# Patient Record
Sex: Male | Born: 1990 | Race: White | Hispanic: No | Marital: Single | Smoking: Current every day smoker
Health system: Southern US, Community
[De-identification: ages and names within clinical notes are randomized; demographics above are authoritative.]

## PROBLEM LIST (undated history)

## (undated) DIAGNOSIS — J029 Acute pharyngitis, unspecified: Secondary | ICD-10-CM

## (undated) DIAGNOSIS — K219 Gastro-esophageal reflux disease without esophagitis: Secondary | ICD-10-CM

## (undated) DIAGNOSIS — R0989 Other specified symptoms and signs involving the circulatory and respiratory systems: Secondary | ICD-10-CM

## (undated) DIAGNOSIS — M25311 Other instability, right shoulder: Secondary | ICD-10-CM

## (undated) DIAGNOSIS — F319 Bipolar disorder, unspecified: Secondary | ICD-10-CM

## (undated) HISTORY — PX: UMBILICAL GRANULOMA EXCISION: SHX2597

---
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
## 2010-06-02 LAB — RAPID URINE DRUG SCREEN, HOSP PERFORMED
Amphetamines: NOT DETECTED
Barbiturates: NOT DETECTED
Benzodiazepines: NOT DETECTED
Cocaine: NOT DETECTED
Opiates: NOT DETECTED
Tetrahydrocannabinol: POSITIVE — AB

## 2010-06-02 LAB — BASIC METABOLIC PANEL
BUN: 7 mg/dL (ref 6–23)
CO2: 26 mEq/L (ref 19–32)
Calcium: 9.4 mg/dL (ref 8.4–10.5)
Chloride: 108 mEq/L (ref 96–112)
Creatinine, Ser: 0.96 mg/dL (ref 0.4–1.5)
Glucose, Bld: 91 mg/dL (ref 70–99)
Potassium: 4 mEq/L (ref 3.5–5.1)
Sodium: 141 mEq/L (ref 135–145)

## 2010-06-02 LAB — DIFFERENTIAL
Basophils Absolute: 0 10*3/uL (ref 0.0–0.1)
Basophils Relative: 0 % (ref 0–1)
Monocytes Absolute: 0.4 10*3/uL (ref 0.2–1.2)
Neutro Abs: 8.5 10*3/uL — ABNORMAL HIGH (ref 1.7–8.0)

## 2010-06-02 LAB — CBC
HCT: 45 % (ref 36.0–49.0)
Hemoglobin: 15.4 g/dL (ref 12.0–16.0)
MCHC: 34.1 g/dL (ref 31.0–37.0)
MCV: 89.7 fL (ref 78.0–98.0)
Platelets: 173 10*3/uL (ref 150–400)
RBC: 5.02 MIL/uL (ref 3.80–5.70)
RDW: 13.1 % (ref 11.4–15.5)
WBC: 11.2 10*3/uL (ref 4.5–13.5)

## 2010-06-02 LAB — ETHANOL: Alcohol, Ethyl (B): <5 mg/dL (ref 0–10)

## 2010-11-22 LAB — RAPID STREP SCREEN (MED CTR MEBANE ONLY): Streptococcus, Group A Screen (Direct): NEGATIVE

## 2010-12-29 ENCOUNTER — Emergency Department (HOSPITAL_COMMUNITY): Payer: No Typology Code available for payment source

## 2010-12-29 ENCOUNTER — Emergency Department (HOSPITAL_COMMUNITY)
Admission: EM | Admit: 2010-12-29 | Discharge: 2010-12-29 | Disposition: A | Discharge status: Home / Self Care | Payer: No Typology Code available for payment source | Attending: Emergency Medicine | Admitting: Emergency Medicine

## 2010-12-29 DIAGNOSIS — S81009A Unspecified open wound, unspecified knee, initial encounter: Secondary | ICD-10-CM | POA: Insufficient documentation

## 2010-12-29 DIAGNOSIS — M25469 Effusion, unspecified knee: Secondary | ICD-10-CM | POA: Insufficient documentation

## 2010-12-29 DIAGNOSIS — Y9351 Activity, roller skating (inline) and skateboarding: Secondary | ICD-10-CM | POA: Insufficient documentation

## 2010-12-29 DIAGNOSIS — F17200 Nicotine dependence, unspecified, uncomplicated: Secondary | ICD-10-CM | POA: Insufficient documentation

## 2010-12-29 DIAGNOSIS — S8990XA Unspecified injury of unspecified lower leg, initial encounter: Secondary | ICD-10-CM

## 2010-12-29 DIAGNOSIS — IMO0002 Reserved for concepts with insufficient information to code with codable children: Secondary | ICD-10-CM | POA: Insufficient documentation

## 2010-12-29 DIAGNOSIS — S8000XA Contusion of unspecified knee, initial encounter: Secondary | ICD-10-CM | POA: Insufficient documentation

## 2010-12-29 DIAGNOSIS — M25569 Pain in unspecified knee: Secondary | ICD-10-CM | POA: Insufficient documentation

## 2010-12-29 DIAGNOSIS — S91009A Unspecified open wound, unspecified ankle, initial encounter: Secondary | ICD-10-CM | POA: Insufficient documentation

## 2010-12-29 MED ORDER — HYDROCODONE-ACETAMINOPHEN 5-325 MG PO TABS: 1.0000 | ORAL_TABLET | Freq: Four times a day (QID) | ORAL | Status: AC | PRN

## 2010-12-29 MED ORDER — TETANUS-DIPHTH-ACELL PERTUSSIS 5-2.5-18.5 LF-MCG/0.5 IM SUSP
INTRAMUSCULAR | Status: AC
  Filled 2010-12-29: qty 0.5

## 2010-12-29 MED ORDER — HYDROCODONE-ACETAMINOPHEN 5-325 MG PO TABS
1.0000 | ORAL_TABLET | Freq: Once | ORAL | Status: AC
  Administered 2010-12-29: 1 via ORAL

## 2010-12-29 MED ORDER — TETANUS-DIPHTH-ACELL PERTUSSIS 5-2-15.5 LF-MCG/0.5 IM SUSP
0.5000 mL | Freq: Once | INTRAMUSCULAR | Status: AC
  Administered 2010-12-29: 0.5 mL via INTRAMUSCULAR
  Filled 2010-12-29: qty 0.5

## 2010-12-29 MED ORDER — DOXYCYCLINE HYCLATE 100 MG PO CAPS: 100.0000 mg | ORAL_CAPSULE | Freq: Two times a day (BID) | ORAL | Status: AC

## 2010-12-29 MED ORDER — HYDROCODONE-ACETAMINOPHEN 5-325 MG PO TABS
ORAL_TABLET | ORAL | Status: AC
  Filled 2010-12-29: qty 1

## 2010-12-29 MED ORDER — DOXYCYCLINE HYCLATE 100 MG PO CAPS: 100.0000 mg | ORAL_CAPSULE | Freq: Two times a day (BID) | ORAL | Status: DC

## 2010-12-29 NOTE — ED Provider Notes (Signed)
Medical screening examination/treatment/procedure(s) were conducted as a shared visit with non-physician practitioner(s) and myself.  I personally evaluated the patient during the encounter Patient struck his knee a hard surface while skateboarding. Anterior knee with linear laceration with surrounding redness swelling and tenderness. X-ray viewed by me, no fracture. Agree with radiologist interpretation. Treat for wound infection  Doug Sou, MD 12/29/10 [phone]

## 2010-12-29 NOTE — ED Provider Notes (Signed)
History     CSN: [id number] Arrival date & time: 12/29/2010 12:02 PM   First MD Initiated Contact with Patient 12/29/10 1321      Chief Complaint  Patient presents with  . Knee Pain    pt in with c/o right knee pain states injured friday states swelling and unable to move knee states pain 8/10    (Consider location/radiation/quality/duration/timing/severity/associated sxs/prior treatment) Patient is a 20 y.o. male presenting with knee pain. The history is provided by the patient.  Knee Pain   patient states that he was skateboarding 2 nights ago and struck his right knee against the ramp and he had been having swelling and pain to the knee.  Patient also has a laceration to that knee as well.  Patient denies fever, numbness, weakness, vomiting, nausea, or drainage from the wound.  There is redness around the wound.  History reviewed. No pertinent past medical history.  History reviewed. No pertinent past surgical history.  No family history on file.  History  Substance Use Topics  . Smoking status: Current Everyday Smoker  . Smokeless tobacco: Not on file  . Alcohol Use: No      Review of Systems  All other systems reviewed and are negative.    Allergies  Review of patient's allergies indicates no known allergies.  Home Medications  No current outpatient prescriptions on file.  BP 127/65  Pulse 66  Temp(Src) 98.6 F (37 C) (Oral)  Resp 18  SpO2 100%  Physical Exam  Constitutional: He appears well-developed and well-nourished.  HENT:  Head: Normocephalic and atraumatic.  Eyes: Pupils are equal, round, and reactive to light.  Cardiovascular: Normal rate and regular rhythm.   Pulmonary/Chest: Effort normal and breath sounds normal.  Musculoskeletal:       Right knee: He exhibits swelling, ecchymosis and erythema.       Legs:   ED Course  Procedures (including critical care time)  Labs Reviewed - No data to display Dg Knee Complete 4 Views  Right  12/29/2010  *RADIOLOGY REPORT*  Clinical Data: Fall, knee pain  RIGHT KNEE - COMPLETE 4+ VIEW  Comparison: None.  Findings: Anterior knee patellar region soft tissue swelling. Normal alignment without fracture.  No effusion.  Preserved joint spaces.  IMPRESSION: Anterior patellar soft tissue swelling.  No acute osseous finding.  Original Report Authenticated By: Judie Petit. Ruel Favors, M.D.     No diagnosis found.    MDM  Patient does not have any fractures noted on x-ray.  The patient will be treated with an antibiotic due to the mild surrounding erythema to the small laceration.  The patient will be referred to orthopedics for followup on his knee.  Told to keep the wound clean and dry.  Return here for any worsening in his condition        Carlyle Dolly, [place] 12/29/10 [phone]

## 2012-10-04 ENCOUNTER — Encounter (HOSPITAL_COMMUNITY): Payer: Self-pay | Admitting: Emergency Medicine

## 2012-10-04 ENCOUNTER — Emergency Department (HOSPITAL_COMMUNITY)
Admission: EM | Admit: 2012-10-04 | Discharge: 2012-10-04 | Disposition: A | Discharge status: Home / Self Care | Payer: Medicaid Other | Attending: Emergency Medicine | Admitting: Emergency Medicine

## 2012-10-04 ENCOUNTER — Emergency Department (HOSPITAL_COMMUNITY): Payer: Medicaid Other

## 2012-10-04 DIAGNOSIS — M24419 Recurrent dislocation, unspecified shoulder: Secondary | ICD-10-CM | POA: Insufficient documentation

## 2012-10-04 DIAGNOSIS — M24411 Recurrent dislocation, right shoulder: Secondary | ICD-10-CM

## 2012-10-04 DIAGNOSIS — F17200 Nicotine dependence, unspecified, uncomplicated: Secondary | ICD-10-CM | POA: Insufficient documentation

## 2012-10-04 DIAGNOSIS — X500XXA Overexertion from strenuous movement or load, initial encounter: Secondary | ICD-10-CM | POA: Insufficient documentation

## 2012-10-04 DIAGNOSIS — Y9368 Activity, volleyball (beach) (court): Secondary | ICD-10-CM | POA: Insufficient documentation

## 2012-10-04 DIAGNOSIS — Y9239 Other specified sports and athletic area as the place of occurrence of the external cause: Secondary | ICD-10-CM | POA: Insufficient documentation

## 2012-10-04 MED ORDER — PROPOFOL 10 MG/ML IV BOLUS
0.5000 mg/kg | Freq: Once | INTRAVENOUS | Status: AC
  Administered 2012-10-04: 40.8 mg via INTRAVENOUS
  Filled 2012-10-04 (×2): qty 20

## 2012-10-04 MED ORDER — PROPOFOL 10 MG/ML IV BOLUS
INTRAVENOUS | Status: AC | PRN
  Administered 2012-10-04: 20 mg via INTRAVENOUS
  Administered 2012-10-04 (×3): 40 mg via INTRAVENOUS

## 2012-10-04 MED ORDER — HYDROCODONE-ACETAMINOPHEN 5-325 MG PO TABS: 2.0000 | ORAL_TABLET | ORAL | Status: DC | PRN

## 2012-10-04 MED ORDER — ONDANSETRON HCL 4 MG/2ML IJ SOLN
4.0000 mg | Freq: Once | INTRAMUSCULAR | Status: AC
  Administered 2012-10-04: 4 mg via INTRAVENOUS
  Filled 2012-10-04: qty 2

## 2012-10-04 NOTE — ED Notes (Signed)
Pt brought in by EMS. Pt states that he was playing volleyball, when he reached for the ball, and says that he over overextended his arm. Pt reports that he has dislocated his shoulder 2 or 3 times previously. Pt received 150 mcg of Fentanyl per EMS.

## 2012-10-04 NOTE — ED Provider Notes (Signed)
CSN: [id number]     Arrival date & time 10/04/12  2143 History     First MD Initiated Contact with Patient 10/04/12 2156     Chief Complaint  Patient presents with  . Shoulder Pain   HPI   Patient with prior shoulder dislocations x2. He states it is gone back in on its own. He's never been seen by a physician. Playing volleyball he said he put his right arm up in the air and did not make contact with [place] to clunk and pain in his shoulder and has not been able to move it withouta great deal of pain since Past Medical History  Diagnosis Date  . Shoulder dislocation     Previous dislocstions have been on the right side.    History reviewed. No pertinent past surgical history. History reviewed. No pertinent family history. History  Substance Use Topics  . Smoking status: Current Every Day Smoker -- 0.50 packs/day    Types: Cigarettes  . Smokeless tobacco: Not on file  . Alcohol Use: Yes    Review of Systems  Musculoskeletal: Positive for myalgias and arthralgias.  Neurological: Negative for weakness and numbness.    Allergies  Review of patient's allergies indicates no known allergies.  Home Medications   Current Outpatient Rx  Name  Route  Sig  Dispense  Refill  . HYDROcodone-acetaminophen (NORCO/VICODIN) 5-325 MG per tablet   Oral   Take 2 tablets by mouth every 4 (four) hours as needed for pain.   10 tablet   0    BP 125/72  Pulse 94  Temp(Src) 98.1 F (36.7 C) (Oral)  Resp 19  Ht 6\' 6"  (1.981 m)  Wt 180 lb (81.647 kg)  BMI 20.81 kg/m2  SpO2 98% Physical Exam Physical exam shows an awake alert. She is pain with any movement. His slumped forward holding his right arm across to his legs and holding his right forearm and his left arm. His plantar shoulder dislocation. His normal axillary nerve distribution sensation. Good pulses sensation in the right upper extremity. No pain in the chest wall. HEENT normal lungs clear heart regular abdomen soft neurological  intact ED Course   Procedural sedation Date/Time: 10/04/2012 10:35 PM Performed by: Claudean Kinds Authorized by: Claudean Kinds Consent: Verbal consent obtained. written consent not obtained. Risks and benefits: risks, benefits and alternatives were discussed Consent given by: patient Patient understanding: patient states understanding of the procedure being performed Patient identity confirmed: verbally with patient Time out: Immediately prior to procedure a "time out" was called to verify the correct patient, procedure, equipment, support staff and site/side marked as required. Local anesthesia used: no Patient sedated: yes Sedatives: propofol Sedation start date/time: 10/04/2012 10:35 PM Sedation end date/time: 10/04/2012 10:45 AM Vitals: Vital signs were monitored during sedation. Patient tolerance: Patient tolerated the procedure well with no immediate complications.  Reduction of dislocation Date/Time: 10/04/2012 10:35 PM Performed by: Claudean Kinds Authorized by: Claudean Kinds Consent: Verbal consent obtained. written consent obtained. Risks and benefits: risks, benefits and alternatives were discussed Consent given by: patient Patient understanding: patient states understanding of the procedure being performed Patient identity confirmed: verbally with patient and arm band Time out: Immediately prior to procedure a "time out" was called to verify the correct patient, procedure, equipment, support staff and site/side marked as required. Patient sedated: yes Sedatives: propofol Patient tolerance: Patient tolerated the procedure well with no immediate complications. Comments: Easily reduced with   (including critical care time)  Labs Reviewed - No data to display No results found. 1. Shoulder dislocation, recurrent, right     MDM  He tolerated the sedation and relocation very well post procedure x-ray showed good location.  Normal N-v exam after  relocation.  Plan, Ortho F/U, Norco, Ice.   Claudean Kinds, MD 10/04/12 [phone]

## 2012-10-04 NOTE — ED Notes (Signed)
MD at bedside. 

## 2013-04-23 ENCOUNTER — Emergency Department (HOSPITAL_COMMUNITY)
Admission: EM | Admit: 2013-04-23 | Discharge: 2013-04-23 | Disposition: A | Discharge status: Home / Self Care | Payer: Medicaid Other | Attending: Emergency Medicine | Admitting: Emergency Medicine

## 2013-04-23 ENCOUNTER — Emergency Department (HOSPITAL_COMMUNITY): Payer: Medicaid Other

## 2013-04-23 ENCOUNTER — Encounter (HOSPITAL_COMMUNITY): Payer: Self-pay | Admitting: Emergency Medicine

## 2013-04-23 DIAGNOSIS — Y92838 Other recreation area as the place of occurrence of the external cause: Secondary | ICD-10-CM

## 2013-04-23 DIAGNOSIS — S43016A Anterior dislocation of unspecified humerus, initial encounter: Secondary | ICD-10-CM | POA: Insufficient documentation

## 2013-04-23 DIAGNOSIS — Y9379 Activity, other specified sports and athletics: Secondary | ICD-10-CM | POA: Insufficient documentation

## 2013-04-23 DIAGNOSIS — X500XXA Overexertion from strenuous movement or load, initial encounter: Secondary | ICD-10-CM | POA: Insufficient documentation

## 2013-04-23 DIAGNOSIS — S43014A Anterior dislocation of right humerus, initial encounter: Secondary | ICD-10-CM

## 2013-04-23 DIAGNOSIS — F17200 Nicotine dependence, unspecified, uncomplicated: Secondary | ICD-10-CM | POA: Insufficient documentation

## 2013-04-23 DIAGNOSIS — Y9239 Other specified sports and athletic area as the place of occurrence of the external cause: Secondary | ICD-10-CM | POA: Insufficient documentation

## 2013-04-23 MED ORDER — FENTANYL CITRATE 0.05 MG/ML IJ SOLN
50.0000 ug | Freq: Once | INTRAMUSCULAR | Status: AC
  Administered 2013-04-23: 50 ug via INTRAVENOUS
  Filled 2013-04-23: qty 2

## 2013-04-23 MED ORDER — PROPOFOL 10 MG/ML IV BOLUS
INTRAVENOUS | Status: AC | PRN
  Administered 2013-04-23: 40 mg via INTRAVENOUS

## 2013-04-23 MED ORDER — OXYCODONE-ACETAMINOPHEN 5-325 MG PO TABS: 1.0000 | ORAL_TABLET | ORAL | Status: DC | PRN

## 2013-04-23 MED ORDER — PROPOFOL 10 MG/ML IV BOLUS
0.5000 mg/kg | Freq: Once | INTRAVENOUS | Status: AC
  Administered 2013-04-23: 39.7 mg via INTRAVENOUS
  Filled 2013-04-23: qty 1

## 2013-04-23 NOTE — Discharge Instructions (Signed)
Keep your arm in a shoulder immobilizer until you see the orthopedic physician. Call on Monday for an appointment as soon as possible.   Shoulder Dislocation Your shoulder is made up of three bones: the collar bone (clavicle); the shoulder blade (scapula), which includes the socket (glenoid cavity); and the upper arm bone (humerus). Your shoulder joint is the place where these bones meet. Strong, fibrous tissues hold these bones together (ligaments). Muscles and strong, fibrous tissues that connect the muscles to these bones (tendons) allow your arm to move through this joint. The range of motion of your shoulder joint is more extensive than most of your other joints, and the glenoid cavity is very shallow. That is the reason that your shoulder joint is one of the most unstable joints in your body. It is far more prone to dislocation than your other joints. Shoulder dislocation is when your humerus is forced out of your shoulder joint. CAUSES Shoulder dislocation is caused by a forceful impact on your shoulder. This impact usually is from an injury, such as a sports injury or a fall. SYMPTOMS Symptoms of shoulder dislocation include:  Deformity of your shoulder.  Intense pain.  Inability to move your shoulder joint.  Numbness, weakness, or tingling around your shoulder joint (your neck or down your arm).  Bruising or swelling around your shoulder. DIAGNOSIS In order to diagnose a dislocated shoulder, your caregiver will perform a physical exam. Your caregiver also may have an X-ray exam done to see if you have any broken bones. Magnetic resonance imaging (MRI) is a procedure that sometimes is done to help your caregiver see any damage to the soft tissues around your shoulder, particularly your rotator cuff tendons. Additionally, your caregiver also may have electromyography done to measure the electrical discharges produced in your muscles if you have signs or symptoms of nerve  damage. TREATMENT A shoulder dislocation is treated by placing the humerus back in the joint (reduction). Your caregiver does this either manually (closed reduction), by moving your humerus back into the joint through manipulation, or through surgery (open reduction). When your humerus is back in place, severe pain should improve almost immediately. You also may need to have surgery if you have a weak shoulder joint or ligaments, and you have recurring shoulder dislocations, despite rehabilitation. In rare cases, surgery is necessary if your nerves or blood vessels are damaged during the dislocation. After your reduction, your arm will be placed in a shoulder immobilizer or sling to keep it from moving. Your caregiver will have you wear your shoulder immoblizer or sling for 3 days to 3 weeks, depending on how serious your dislocation is. When your shoulder immobilizer or sling is removed, your caregiver may prescribe physical therapy to help improve the range of motion in your shoulder joint. HOME CARE INSTRUCTIONS  The following measures can help to reduce pain and speed up the healing process:  Rest your injured joint. Do not move it. Avoid activities similar to the one that caused your injury.  Apply ice to your injured joint for the first day or two after your reduction or as directed by your caregiver. Applying ice helps to reduce inflammation and pain.  Put ice in a plastic bag.  Place a towel between your skin and the bag.  Leave the ice on for 15-20 minutes at a time, every 2 hours while you are awake.  Exercise your hand by squeezing a soft ball. This helps to eliminate stiffness and swelling in your hand  and wrist.  Take over-the-counter or prescription medicine for pain or discomfort as told by your caregiver. SEEK IMMEDIATE MEDICAL CARE IF:   Your shoulder immobilizer or sling becomes damaged.  Your pain becomes worse rather than better.  You lose feeling in your arm or hand,  or they become white and cold. MAKE SURE YOU:   Understand these instructions.  Will watch your condition.  Will get help right away if you are not doing well or get worse. Document Released: 11/05/2000 Document Revised: 05/05/2011 Document Reviewed: 12/01/2010 [facility] PaExitCare Patient Information 2014 [place], [place].  Acetaminophen; Oxycodone tablets What is this medicine? ACETAMINOPHEN; OXYCODONE (a set a MEE noe fen; ox i KOE done) is a pain reliever. It is used to treat mild to moderate pain. This medicine may be used for other purposes; ask your health care provider or pharmacist if you have questions. COMMON BRAND NAME(S): Endocet, Magnacet, Narvox, Percocet, Perloxx, Primalev, Primlev, Roxicet, Xolox What should I tell my health care provider before I take this medicine? They need to know if you have any of these conditions: -brain tumor -Crohn's disease, inflammatory bowel disease, or ulcerative colitis -drug abuse or addiction -head injury -heart or circulation problems -if you often drink alcohol -kidney disease or problems going to the bathroom -liver disease -lung disease, asthma, or breathing problems -an unusual or allergic reaction to acetaminophen, oxycodone, other opioid analgesics, other medicines, foods, dyes, or preservatives -pregnant or trying to get pregnant -breast-feeding How should I use this medicine? Take this medicine by mouth with a full glass of water. Follow the directions on the prescription label. Take your medicine at regular intervals. Do not take your medicine more often than directed. Talk to your pediatrician regarding the use of this medicine in children. Special care may be needed. Patients over 23 years old may have a stronger reaction and need a smaller dose. Overdosage: If you think you have taken too much of this medicine contact a poison control center or emergency room at once. NOTE: This medicine is only for you. Do not share this medicine  with others. What if I miss a dose? If you miss a dose, take it as soon as you can. If it is almost time for your next dose, take only that dose. Do not take double or extra doses. What may interact with this medicine? -alcohol -antihistamines -barbiturates like amobarbital, butalbital, butabarbital, methohexital, pentobarbital, phenobarbital, thiopental, and secobarbital -benztropine -drugs for bladder problems like solifenacin, trospium, oxybutynin, tolterodine, hyoscyamine, and methscopolamine -drugs for breathing problems like ipratropium and tiotropium -drugs for certain stomach or intestine problems like propantheline, homatropine methylbromide, glycopyrrolate, atropine, belladonna, and dicyclomine -general anesthetics like etomidate, ketamine, nitrous oxide, propofol, desflurane, enflurane, halothane, isoflurane, and sevoflurane -medicines for depression, anxiety, or psychotic disturbances -medicines for sleep -muscle relaxants -naltrexone -narcotic medicines (opiates) for pain -phenothiazines like perphenazine, thioridazine, chlorpromazine, mesoridazine, fluphenazine, prochlorperazine, promazine, and trifluoperazine -scopolamine -tramadol -trihexyphenidyl This list may not describe all possible interactions. Give your health care provider a list of all the medicines, herbs, non-prescription drugs, or dietary supplements you use. Also tell them if you smoke, drink alcohol, or use illegal drugs. Some items may interact with your medicine. What should I watch for while using this medicine? Tell your doctor or health care professional if your pain does not go away, if it gets worse, or if you have new or a different type of pain. You may develop tolerance to the medicine. Tolerance means that you will need a higher dose of  the medication for pain relief. Tolerance is normal and is expected if you take this medicine for a long time. Do not suddenly stop taking your medicine because you may  develop a severe reaction. Your body becomes used to the medicine. This does NOT mean you are addicted. Addiction is a behavior related to getting and using a drug for a non-medical reason. If you have pain, you have a medical reason to take pain medicine. Your doctor will tell you how much medicine to take. If your doctor wants you to stop the medicine, the dose will be slowly lowered over time to avoid any side effects. You may get drowsy or dizzy. Do not drive, use machinery, or do anything that needs mental alertness until you know how this medicine affects you. Do not stand or sit up quickly, especially if you are an older patient. This reduces the risk of dizzy or fainting spells. Alcohol may interfere with the effect of this medicine. Avoid alcoholic drinks. There are different types of narcotic medicines (opiates) for pain. If you take more than one type at the same time, you may have more side effects. Give your health care provider a list of all medicines you use. Your doctor will tell you how much medicine to take. Do not take more medicine than directed. Call emergency for help if you have problems breathing. The medicine will cause constipation. Try to have a bowel movement at least every 2 to 3 days. If you do not have a bowel movement for 3 days, call your doctor or health care professional. Do not take Tylenol (acetaminophen) or medicines that have acetaminophen with this medicine. Too much acetaminophen can be very dangerous. Many nonprescription medicines contain acetaminophen. Always read the labels carefully to avoid taking more acetaminophen. What side effects may I notice from receiving this medicine? Side effects that you should report to your doctor or health care professional as soon as possible: -allergic reactions like skin rash, itching or hives, swelling of the face, lips, or tongue -breathing difficulties, wheezing -confusion -light headedness or fainting spells -severe  stomach pain -unusually weak or tired -yellowing of the skin or the whites of the eyes  Side effects that usually do not require medical attention (report to your doctor or health care professional if they continue or are bothersome): -dizziness -drowsiness -nausea -vomiting This list may not describe all possible side effects. Call your doctor for medical advice about side effects. You may report side effects to FDA at 1-800-FDA-1088. Where should I keep my medicine? Keep out of the reach of children. This medicine can be abused. Keep your medicine in a safe place to protect it from theft. Do not share this medicine with anyone. Selling or giving away this medicine is dangerous and against the law. Store at room temperature between 20 and 25 degrees C (68 and 77 degrees F). Keep container tightly closed. Protect from light. This medicine may cause accidental overdose and death if it is taken by other adults, children, or pets. Flush any unused medicine down the toilet to reduce the chance of harm. Do not use the medicine after the expiration date. NOTE: This sheet is a summary. It may not cover all possible information. If you have questions about this medicine, talk to your doctor, pharmacist, or health care provider.  2014, [employer]. (2012-10-04 13:17:35)

## 2013-04-23 NOTE — ED Notes (Signed)
Patient is alert and oriented x3.  He was given DC instructions and follow up visit instructions.  Patient gave verbal understanding.  He was DC ambulatory under his own power to home.  V/S stable.  He was not showing any signs of distress on DC 

## 2013-04-23 NOTE — ED Provider Notes (Signed)
CSN: [id number]     Arrival date & time 04/23/13  0200 History   First MD Initiated Contact with Patient 04/23/13 0300     Chief Complaint  Patient presents with  . Dislocation     (Consider location/radiation/quality/duration/timing/severity/associated sxs/prior Treatment) The history is provided by the patient.   23 year old male with history of recurrent right shoulder dislocations states that he was playing table tennis when his right shoulder popped out of place. He had been drinking and admits to having about 6 ounces of gin with the last consumption about 2 hours ago. He is complaining of pain in his shoulder and he rates it at 10/10. He is a cigarette smoker.  Past Medical History  Diagnosis Date  . Shoulder dislocation     Previous dislocstions have been on the right side.    History reviewed. No pertinent past surgical history. History reviewed. No pertinent family history. History  Substance Use Topics  . Smoking status: Current Every Day Smoker -- 0.50 packs/day    Types: Cigarettes  . Smokeless tobacco: Not on file  . Alcohol Use: Yes    Review of Systems  All other systems reviewed and are negative.      Allergies  Review of patient's allergies indicates no known allergies.  Home Medications   Current Outpatient Rx  Name  Route  Sig  Dispense  Refill  . Pseudoephedrine-APAP-DM (DAYQUIL PO)   Oral   Take 1-2 capsules by mouth 2 (two) times daily as needed (cold symptoms).          BP 128/70  Pulse 85  Temp(Src) 98.5 F (36.9 C) (Oral)  Resp 16  SpO2 98% Physical Exam  Nursing note and vitals reviewed.  23 year old male, resting comfortably and in no acute distress. Vital signs are normal. Oxygen saturation is 98%, which is normal. Head is normocephalic and atraumatic. PERRLA, EOMI. Oropharynx is clear. Neck is nontender and supple without adenopathy or JVD. Back is nontender and there is no CVA tenderness. Lungs are clear without rales,  wheezes, or rhonchi. Chest is nontender. Heart has regular rate and rhythm without murmur. Abdomen is soft, flat, nontender without masses or hepatosplenomegaly and peristalsis is normoactive. Extremities:.Deformity of the right shoulder is present consistent with anterior dislocation. There is pain on any passive range of motion. Distal neurovascular exam is intact with prompt capillary refill, normal sensation, and strong pulses. Skin is warm and dry without rash. Neurologic: Mental status is normal, cranial nerves are intact, there are no motor or sensory deficits.  ED Course  Reduction of dislocation Date/Time: 04/23/2013 4:15 AM Performed by: Dione Booze Authorized by: Preston Fleeting, Lauralei Clouse Consent: Verbal consent obtained. written consent not obtained. Risks and benefits: risks, benefits and alternatives were discussed Consent given by: patient Patient understanding: patient states understanding of the procedure being performed Patient consent: the patient's understanding of the procedure matches consent given Procedure consent: procedure consent matches procedure scheduled Relevant documents: relevant documents present and verified Test results: test results available and properly labeled Site marked: the operative site was marked Imaging studies: imaging studies available Required items: required blood products, implants, devices, and special equipment available Patient identity confirmed: verbally with patient and arm band Time out: Immediately prior to procedure a "time out" was called to verify the correct patient, procedure, equipment, support staff and site/side marked as required. Local anesthesia used: no Patient sedated: yes Sedatives: propofol Analgesia: fentanyl Sedation start date/time: 04/23/2013 4:15 AM Sedation end date/time: 04/23/2013 4:45 AM Patient  tolerance: Patient tolerated the procedure well with no immediate complications. Comments: Right shoulder dislocation  reduced by manipulation. Neurovascular status after reduction was normal. He was placed in a shoulder immobilizer and post-reduction x-ray ordered.   (including critical care time) Procedural sedation Performed by: WUJWJ,XBJYNGLICK,Jahmiyah Dullea Consent: Verbal consent obtained. Risks and benefits: risks, benefits and alternatives were discussed Required items: required blood products, implants, devices, and special equipment available Patient identity confirmed: arm band and provided demographic data Time out: Immediately prior to procedure a "time out" was called to verify the correct patient, procedure, equipment, support staff and site/side marked as required.  Sedation type: moderate (conscious) sedation NPO time confirmed and considedered  Sedatives: PROPOFOL  Physician Time at Bedside: 30 minutes  Vitals: Vital signs were monitored during sedation. Cardiac Monitor, pulse oximeter Patient tolerance: Patient tolerated the procedure well with no immediate complications. Comments: Pt with uneventful recovered. Returned to pre-procedural sedation baseline   Imaging Review Dg Shoulder Right  04/23/2013   CLINICAL DATA:  Status post reduction of right shoulder dislocation.  EXAM: RIGHT SHOULDER - 2+ VIEW  COMPARISON:  Right shoulder radiographs performed earlier today at 3:20 a.m.  FINDINGS: There has been successful reduction of the previously noted right humeral head dislocation. An associated Hill-Sachs lesion is seen. There is slight cortical irregularity at the inferior glenoid, without a definite osseous Bankart lesion. The right acromioclavicular joint is unremarkable in appearance. The visualized portions of the right lung are clear. No significant soft tissue abnormalities are characterized on radiograph.  IMPRESSION: Successful reduction of right femoral head dislocation. Associated Hill-Sachs lesion seen. Slight cortical irregularity at the inferior glenoid, without a definite osseous Bankart  lesion.   Electronically Signed   By: Roanna RaiderJeffery  Chang M.D.   On: 04/23/2013 04:43   Dg Shoulder Right  04/23/2013   CLINICAL DATA:  Dislocation  EXAM: RIGHT SHOULDER - 2+ VIEW  COMPARISON:  Prior radiograph from 10/04/2012  FINDINGS: The right humeral head is dislocated anteriorly and inferiorly relative to the glenoid. No definite associated fracture. The [facility] joint is approximated.  Visualize right hemi thorax is clear.  IMPRESSION: Anterior inferior right shoulder dislocation. No associated fracture identified.   Electronically Signed   By: Rise MuBenjamin  McClintock M.D.   On: 04/23/2013 03:48   Images viewed by me.  MDM   Final diagnoses:  Closed anterior dislocation of right shoulder    Recurrent right shoulder dislocation. Old records are reviewed and he has been seen in approximately 6 months ago for right shoulder dislocation. An attempt to reduce the dislocation by scapular manipulation was not successful. He'll be sent for x-ray and will need procedural sedation to reduce his dislocation.  Following reduction, x-ray shows that the joint has been relocated and Hill-Sachs lesion is present. He is placed in a shoulder immobilizer and referred to orthopedics for followup. Because of recurrent dislocations, he should be considered for possible surgical management. He is given prescription for oxycodone and acetaminophen for pain.  Dione Boozeavid Semya Klinke, MD 04/23/13 [phone]

## 2013-04-23 NOTE — ED Notes (Signed)
Patient is alert and oriented x3.  He is complaining of pain with a dislocated right shoulder while playing ping pong. Currently he rates his pain 10 of 10.  He has dislocated this shoulder multiple times.

## 2013-04-23 NOTE — ED Notes (Signed)
Pt arrived to the ED with a complaint of a right shoulder dislocation.  Pt has a hx of dislocation of same.  Pt was playing pingpong when his shoulder popped out.  Pt states he has been told he needs surgery but hasn't had it done.

## 2013-05-19 ENCOUNTER — Emergency Department (HOSPITAL_COMMUNITY): Payer: Medicaid Other

## 2013-05-19 ENCOUNTER — Encounter (HOSPITAL_COMMUNITY): Payer: Self-pay | Admitting: Emergency Medicine

## 2013-05-19 ENCOUNTER — Emergency Department (HOSPITAL_COMMUNITY)
Admission: EM | Admit: 2013-05-19 | Discharge: 2013-05-19 | Disposition: A | Discharge status: Home / Self Care | Payer: Medicaid Other | Attending: Emergency Medicine | Admitting: Emergency Medicine

## 2013-05-19 DIAGNOSIS — X500XXA Overexertion from strenuous movement or load, initial encounter: Secondary | ICD-10-CM | POA: Insufficient documentation

## 2013-05-19 DIAGNOSIS — Y9367 Activity, basketball: Secondary | ICD-10-CM | POA: Insufficient documentation

## 2013-05-19 DIAGNOSIS — M24419 Recurrent dislocation, unspecified shoulder: Secondary | ICD-10-CM

## 2013-05-19 DIAGNOSIS — S43016A Anterior dislocation of unspecified humerus, initial encounter: Secondary | ICD-10-CM | POA: Insufficient documentation

## 2013-05-19 DIAGNOSIS — Y9239 Other specified sports and athletic area as the place of occurrence of the external cause: Secondary | ICD-10-CM | POA: Insufficient documentation

## 2013-05-19 DIAGNOSIS — F17200 Nicotine dependence, unspecified, uncomplicated: Secondary | ICD-10-CM | POA: Insufficient documentation

## 2013-05-19 DIAGNOSIS — Y92838 Other recreation area as the place of occurrence of the external cause: Secondary | ICD-10-CM

## 2013-05-19 MED ORDER — LIDOCAINE HCL 2 % IJ SOLN
INTRAMUSCULAR | Status: AC
  Filled 2013-05-19: qty 20

## 2013-05-19 MED ORDER — SODIUM CHLORIDE 0.9 % IV SOLN
INTRAVENOUS | Status: AC | PRN
  Administered 2013-05-19: 1000 mL via INTRAVENOUS

## 2013-05-19 MED ORDER — IBUPROFEN 600 MG PO TABS: 600.0000 mg | ORAL_TABLET | Freq: Four times a day (QID) | ORAL | Status: DC | PRN

## 2013-05-19 MED ORDER — PROPOFOL 10 MG/ML IV BOLUS
200.0000 mg | Freq: Once | INTRAVENOUS | Status: DC
  Filled 2013-05-19: qty 1

## 2013-05-19 MED ORDER — LIDOCAINE HCL 1 % IJ SOLN
INTRAMUSCULAR | Status: AC
  Filled 2013-05-19: qty 20

## 2013-05-19 MED ORDER — HYDROCODONE-ACETAMINOPHEN 5-325 MG PO TABS: 1.0000 | ORAL_TABLET | ORAL | Status: DC | PRN

## 2013-05-19 MED ORDER — HYDROMORPHONE HCL PF 1 MG/ML IJ SOLN
1.0000 mg | Freq: Once | INTRAMUSCULAR | Status: AC
  Administered 2013-05-19: 1 mg via INTRAVENOUS
  Filled 2013-05-19: qty 1

## 2013-05-19 MED ORDER — PROPOFOL 10 MG/ML IV BOLUS
INTRAVENOUS | Status: AC | PRN
  Administered 2013-05-19: 140 mg via INTRAVENOUS

## 2013-05-19 MED ORDER — LIDOCAINE HCL (PF) 1 % IJ SOLN
30.0000 mL | Freq: Once | INTRAMUSCULAR | Status: AC
  Administered 2013-05-19: 30 mL
  Filled 2013-05-19: qty 30

## 2013-05-19 NOTE — ED Notes (Signed)
Pt cussing and yelling in the lobby and moved to Ed conference room. Xray has been notified as to not miss the patient when it is his time for xray. I notified the patient that there were 2 people ahead of him for xrays.

## 2013-05-19 NOTE — ED Notes (Signed)
Pt on his cell phone cursing and speaking very loud while having to wait in the lobby.  I asked pt to please stop cursing as it is disrespectful to the others waiting. Pt states that he has the right to speak however he feels.  Asked Security and [employer] to come speak with pt.

## 2013-05-19 NOTE — ED Notes (Signed)
Pt report shoulder popped out today while playing basketball. This has happened 8-10 in last 6 months pt reports. Pulses are present to lower extremity.

## 2013-05-19 NOTE — ED Notes (Signed)
Pt becoming agitated and verbally abusive after being told that he needed to have a ride come get him prior to d/c.  Pt removing sling and making attempts at dislocating shoulder again.  Pt redirected and educated on importance of using sling.  Pt remains verbally abusive towards staff and minimally cooperative.  EDP aware.

## 2013-05-19 NOTE — ED Notes (Signed)
Pt remains awaiting responsible ride.  Remains verbally abusive towards staff, threatening.  Off duty [employer] officer remains present.

## 2013-05-19 NOTE — ED Provider Notes (Signed)
CSN: [id number]     Arrival date & time 05/19/13  1604 History   First MD Initiated Contact with Patient 05/19/13 1900     No chief complaint on file.    (Consider location/radiation/quality/duration/timing/severity/associated sxs/prior Treatment) HPI  This is a 23 -year-old male with a history of recurrent right shoulder dislocation who presents with right shoulder pain. Patient reports he was playing basketball when his right shoulder popped out. Patient reports 8-10 dislocations in the last 6 months. He has not followed up with orthopedics because "I work too much." He denies any other injury.  He denies any weakness, numbness, or tingling in his extremities.  Past Medical History  Diagnosis Date  . Shoulder dislocation     Previous dislocstions have been on the right side.    History reviewed. No pertinent past surgical history. No family history on file. History  Substance Use Topics  . Smoking status: Current Every Day Smoker -- 0.50 packs/day    Types: Cigarettes  . Smokeless tobacco: Not on file  . Alcohol Use: Yes    Review of Systems  Constitutional: Negative.   Musculoskeletal:       Right shoulder pain  Neurological: Negative for weakness and numbness.  All other systems reviewed and are negative.      Allergies  Review of patient's allergies indicates no known allergies.  Home Medications   Current Outpatient Rx  Name  Route  Sig  Dispense  Refill  . oxyCODONE-acetaminophen (PERCOCET) 5-325 MG per tablet   Oral   Take 1 tablet by mouth every 4 (four) hours as needed.   20 tablet   0   . HYDROcodone-acetaminophen (NORCO/VICODIN) 5-325 MG per tablet   Oral   Take 1-2 tablets by mouth every 4 (four) hours as needed for severe pain.   10 tablet   0   . ibuprofen (ADVIL,MOTRIN) 600 MG tablet   Oral   Take 1 tablet (600 mg total) by mouth every 6 (six) hours as needed.   30 tablet   0    BP 126/77  Pulse 68  Temp(Src) 98.2 F (36.8 C) (Oral)   Resp 16  SpO2 100% Physical Exam  Nursing note and vitals reviewed. Constitutional: He is oriented to person, place, and time. No distress.  Thin  HENT:  Head: Normocephalic and atraumatic.  Cardiovascular: Normal rate and regular rhythm.   Pulmonary/Chest: Effort normal. No respiratory distress.  Musculoskeletal: He exhibits no edema.  Right arm held in flexion and internal rotation, glenoid empty, neurovascularly intact distally  Lymphadenopathy:    He has no cervical adenopathy.  Neurological: He is alert and oriented to person, place, and time.  Skin: Skin is warm and dry.  Psychiatric: He has a normal mood and affect.    ED Course  Reduction of dislocation Date/Time: 05/19/2013 9:27 PM Performed by: Ross MarcusHORTON, COURTNEY, F Authorized by: Ross MarcusHORTON, COURTNEY, F Consent: Verbal consent obtained. written consent obtained. Risks and benefits: risks, benefits and alternatives were discussed Consent given by: patient Patient understanding: patient states understanding of the procedure being performed Patient identity confirmed: verbally with patient and arm band Local anesthesia used: yes Anesthesia: local infiltration Local anesthetic: lidocaine 1% without epinephrine Anesthetic total: 20 ml Patient sedated: yes Sedation type: moderate (conscious) sedation Sedatives: propofol Patient tolerance: Patient tolerated the procedure well with no immediate complications.    Procedural sedation Performed by: Ross MarcusHORTON, COURTNEY, F Consent: Verbal consent obtained. Risks and benefits: risks, benefits and alternatives were discussed Required items: required  blood products, implants, devices, and special equipment available Patient identity confirmed: arm band and provided demographic data Time out: Immediately prior to procedure a "time out" was called to verify the correct patient, procedure, equipment, support staff and site/side marked as required.  Sedation type: moderate (conscious)  sedation NPO time confirmed and considedered  Sedatives: PROPOFOL  Physician Time at Bedside: 20 min  Vitals: Vital signs were monitored during sedation. Cardiac Monitor, pulse oximeter Patient tolerance: Patient tolerated the procedure well with no immediate complications. Comments: Pt with uneventful recovered. Returned to pre-procedural sedation baseline    (including critical care time) Labs Review Labs Reviewed - No data to display Imaging Review Dg Shoulder Right  05/19/2013   CLINICAL DATA:  Post reduction.  EXAM: RIGHT SHOULDER - 2+ VIEW  COMPARISON:  DG SHOULDER *R* dated 05/19/2013; DG SHOULDER *R* dated 04/23/2013; DG SHOULDER *R* dated 04/23/2013  FINDINGS: Successful closed reduction of right shoulder dislocation, humeral head is located. Joint space intact. No destructive bony lesions. Hill-Sachs fracture seen on prior radiograph is less conspicuous which is likely projectional. No new fracture deformity.  IMPRESSION: Successful closed reduction of right shoulder dislocation. Known Hill-Sachs fracture is less conspicuous, which is likely projectional.   Electronically Signed   By: Awilda Metro   On: 05/19/2013 20:51   Dg Shoulder Right  05/19/2013   CLINICAL DATA:  Right shoulder pain.  History of dislocation.  EXAM: RIGHT SHOULDER - 2+ VIEW  COMPARISON:  04/23/2013  FINDINGS: Anterior inferior dislocation of the humeral head. No definite acute fracture. The right lung is clear.  IMPRESSION: Anterior subcoracoid dislocation of the humeral head. No definite fractures.   Electronically Signed   By: Loralie Champagne M.D.   On: 05/19/2013 17:35     EKG Interpretation None      MDM   Final diagnoses:  Shoulder dislocation, recurrent   Patient presents with recurrent shoulder dislocation. Patient required propofol sedation for relocation. Neurovascular exam reassuring following relocation. X-ray shows successful reduction. Patient was instructed to maintain sling and do not  play any sports. He needs to followup with orthopedics as soon as possible.  After history, exam, and medical workup I feel the patient has been appropriately medically screened and is safe for discharge home. Pertinent diagnoses were discussed with the patient. Patient was given return precautions.    Shon Baton, MD 05/19/13 2129

## 2013-05-19 NOTE — ED Notes (Signed)
Initial Contact - pt resting on stretcher, reports R shoulder "popped out" while playing basketball, pt sts "was just here last week for the same".  +deformity noted.  8/10 pain.  +csm/+pulses.  Pt denies other complaints. Skin PWD.  MAEI.  NAD.

## 2013-05-19 NOTE — ED Notes (Signed)
Friend present to provide ride home, pt refusing VS.  Pt remains verbally abusive and threatening towards staff, cursing and yelling.  Escorted to exit by security.

## 2013-05-19 NOTE — ED Notes (Signed)
Post-reduction radiology films.

## 2013-05-19 NOTE — Discharge Instructions (Signed)

## 2013-05-19 NOTE — ED Notes (Signed)
Security and off duty [employer] officer at bs to redirect pt.  Pt sitting on stretcher at this time.

## 2013-11-20 ENCOUNTER — Emergency Department (HOSPITAL_COMMUNITY): Payer: Medicaid Other

## 2013-11-20 ENCOUNTER — Emergency Department (HOSPITAL_COMMUNITY)
Admission: EM | Admit: 2013-11-20 | Discharge: 2013-11-20 | Disposition: A | Discharge status: Home / Self Care | Payer: Medicaid Other | Attending: Emergency Medicine | Admitting: Emergency Medicine

## 2013-11-20 ENCOUNTER — Encounter (HOSPITAL_COMMUNITY): Payer: Self-pay | Admitting: Emergency Medicine

## 2013-11-20 DIAGNOSIS — X500XXA Overexertion from strenuous movement or load, initial encounter: Secondary | ICD-10-CM | POA: Insufficient documentation

## 2013-11-20 DIAGNOSIS — S4980XA Other specified injuries of shoulder and upper arm, unspecified arm, initial encounter: Secondary | ICD-10-CM | POA: Insufficient documentation

## 2013-11-20 DIAGNOSIS — Y929 Unspecified place or not applicable: Secondary | ICD-10-CM | POA: Diagnosis not present

## 2013-11-20 DIAGNOSIS — S46909A Unspecified injury of unspecified muscle, fascia and tendon at shoulder and upper arm level, unspecified arm, initial encounter: Secondary | ICD-10-CM | POA: Diagnosis present

## 2013-11-20 DIAGNOSIS — F17200 Nicotine dependence, unspecified, uncomplicated: Secondary | ICD-10-CM | POA: Diagnosis not present

## 2013-11-20 DIAGNOSIS — Y9389 Activity, other specified: Secondary | ICD-10-CM | POA: Diagnosis not present

## 2013-11-20 DIAGNOSIS — S43016A Anterior dislocation of unspecified humerus, initial encounter: Secondary | ICD-10-CM | POA: Diagnosis not present

## 2013-11-20 DIAGNOSIS — S43014A Anterior dislocation of right humerus, initial encounter: Secondary | ICD-10-CM

## 2013-11-20 MED ORDER — NAPROXEN 500 MG PO TABS: 500.0000 mg | ORAL_TABLET | Freq: Two times a day (BID) | ORAL | Status: DC

## 2013-11-20 MED ORDER — ONDANSETRON HCL 4 MG/2ML IJ SOLN
4.0000 mg | Freq: Once | INTRAMUSCULAR | Status: AC
  Administered 2013-11-20: 4 mg via INTRAVENOUS
  Filled 2013-11-20: qty 2

## 2013-11-20 MED ORDER — KETAMINE HCL 10 MG/ML IJ SOLN: 100.0000 mg | Freq: Once | INTRAMUSCULAR | Status: DC

## 2013-11-20 MED ORDER — FENTANYL CITRATE 0.05 MG/ML IJ SOLN
50.0000 ug | Freq: Once | INTRAMUSCULAR | Status: AC
Start: 2013-11-20 — End: 2013-11-20
  Administered 2013-11-20: 50 ug via NASAL
  Filled 2013-11-20 (×2): qty 2

## 2013-11-20 MED ORDER — PROPOFOL 10 MG/ML IV BOLUS
100.0000 mg | Freq: Once | INTRAVENOUS | Status: DC
  Filled 2013-11-20: qty 20

## 2013-11-20 MED ORDER — FENTANYL CITRATE 0.05 MG/ML IJ SOLN
100.0000 ug | Freq: Once | INTRAMUSCULAR | Status: DC
  Filled 2013-11-20: qty 2

## 2013-11-20 MED ORDER — FENTANYL CITRATE 0.05 MG/ML IJ SOLN
100.0000 ug | Freq: Once | INTRAMUSCULAR | Status: AC
  Administered 2013-11-20: 100 ug via INTRAVENOUS

## 2013-11-20 MED ORDER — PROPOFOL 10 MG/ML IV BOLUS
INTRAVENOUS | Status: AC | PRN
  Administered 2013-11-20: 25 mg via INTRAVENOUS

## 2013-11-20 MED ORDER — KETAMINE HCL 10 MG/ML IJ SOLN
INTRAMUSCULAR | Status: AC | PRN
  Administered 2013-11-20: 25 mg via INTRAVENOUS
  Administered 2013-11-20 (×2): 12.5 mg via INTRAVENOUS

## 2013-11-20 NOTE — ED Provider Notes (Signed)
  Face-to-face evaluation   History: He reports recurrent right shoulder dislocation, this time while playing ping-pong, and another time while putting his jacket on.  Physical exam: Calm, cooperative, mildly uncomfortable. Right shoulder with subacromial defect, consistent with dislocation. Neurovascular intact distally in the right upper arm and hand.    Procedural sedation Performed by: Flint Melter Consent: Verbal consent obtained. Risks and benefits: risks, benefits and alternatives were discussed Required items: required blood products, implants, devices, and special equipment available Patient identity confirmed: arm band and provided demographic data Time out: Immediately prior to procedure a "time out" was called to verify the correct patient, procedure, equipment, support staff and site/side marked as required.  Sedation type: moderate (conscious) sedation NPO time confirmed and considedered  Sedatives: PROPOFOL and Ketamine  Physician Time at Bedside: 15 minutes  Vitals: Vital signs were monitored during sedation. Cardiac Monitor, pulse oximeter Patient tolerance: Patient tolerated the procedure well with no immediate complications. Comments: Pt with uneventful recovered. Returned to pre-procedural sedation baseline     Medical screening examination/treatment/procedure(s) were conducted as a shared visit with non-physician practitioner(s) and myself.  I personally evaluated the patient during the encounter  Flint Melter, MD 11/21/13 [phone]

## 2013-11-20 NOTE — Discharge Instructions (Signed)
Shoulder Dislocation Your shoulder is made up of three bones: the collar bone (clavicle); the shoulder blade (scapula), which includes the socket (glenoid cavity); and the upper arm bone (humerus). Your shoulder joint is the place where these bones meet. Strong, fibrous tissues hold these bones together (ligaments). Muscles and strong, fibrous tissues that connect the muscles to these bones (tendons) allow your arm to move through this joint. The range of motion of your shoulder joint is more extensive than most of your other joints, and the glenoid cavity is very shallow. That is the reason that your shoulder joint is one of the most unstable joints in your body. It is far more prone to dislocation than your other joints. Shoulder dislocation is when your humerus is forced out of your shoulder joint. CAUSES Shoulder dislocation is caused by a forceful impact on your shoulder. This impact usually is from an injury, such as a sports injury or a fall. SYMPTOMS Symptoms of shoulder dislocation include:  Deformity of your shoulder.  Intense pain.  Inability to move your shoulder joint.  Numbness, weakness, or tingling around your shoulder joint (your neck or down your arm).  Bruising or swelling around your shoulder. DIAGNOSIS In order to diagnose a dislocated shoulder, your caregiver will perform a physical exam. Your caregiver also may have an X-ray exam done to see if you have any broken bones. Magnetic resonance imaging (MRI) is a procedure that sometimes is done to help your caregiver see any damage to the soft tissues around your shoulder, particularly your rotator cuff tendons. Additionally, your caregiver also may have electromyography done to measure the electrical discharges produced in your muscles if you have signs or symptoms of nerve damage. TREATMENT A shoulder dislocation is treated by placing the humerus back in the joint (reduction). Your caregiver does this either manually (closed  reduction), by moving your humerus back into the joint through manipulation, or through surgery (open reduction). When your humerus is back in place, severe pain should improve almost immediately. You also may need to have surgery if you have a weak shoulder joint or ligaments, and you have recurring shoulder dislocations, despite rehabilitation. In rare cases, surgery is necessary if your nerves or blood vessels are damaged during the dislocation. After your reduction, your arm will be placed in a shoulder immobilizer or sling to keep it from moving. Your caregiver will have you wear your shoulder immobilizer or sling for 3 days to 3 weeks, depending on how serious your dislocation is. When your shoulder immobilizer or sling is removed, your caregiver may prescribe physical therapy to help improve the range of motion in your shoulder joint. HOME CARE INSTRUCTIONS  The following measures can help to reduce pain and speed up the healing process:  Rest your injured joint. Do not move it. Avoid activities similar to the one that caused your injury.  Apply ice to your injured joint for the first day or two after your reduction or as directed by your caregiver. Applying ice helps to reduce inflammation and pain.  Put ice in a plastic bag.  Place a towel between your skin and the bag.  Leave the ice on for 15-20 minutes at a time, every 2 hours while you are awake.  Exercise your hand by squeezing a soft ball. This helps to eliminate stiffness and swelling in your hand and wrist.  Take over-the-counter or prescription medicine for pain or discomfort as told by your caregiver. SEEK IMMEDIATE MEDICAL CARE IF:   Your  shoulder immobilizer or sling becomes damaged.  Your pain becomes worse rather than better.  You lose feeling in your arm or hand, or they become white and cold. MAKE SURE YOU:   Understand these instructions.  Will watch your condition.  Will get help right away if you are not  doing well or get worse. Document Released: 11/05/2000 Document Revised: 06/27/2013 Document Reviewed: 12/01/2010 [facility] Patient Information 2015 [place], [place]. This information is not intended to replace advice given to you by your health care provider. Make sure you discuss any questions you have with your health care provider.   Emergency Department Resource Guide 1) Find a Doctor and Pay Out of Pocket Although you won't have to find out who is covered by your insurance plan, it is a good idea to ask around and get recommendations. You will then need to call the office and see if the doctor you have chosen will accept you as a new patient and what types of options they offer for patients who are self-pay. Some doctors offer discounts or will set up payment plans for their patients who do not have insurance, but you will need to ask so you aren't surprised when you get to your appointment.  2) Contact Your Local Health Department Not all health departments have doctors that can see patients for sick visits, but many do, so it is worth a call to see if yours does. If you don't know where your local health department is, you can check in your phone book. The CDC also has a tool to help you locate your state's health department, and many state websites also have listings of all of their local health departments.  3) Find a Walk-in Clinic If your illness is not likely to be very severe or complicated, you may want to try a walk in clinic. These are popping up all over the country in pharmacies, drugstores, and shopping centers. They're usually staffed by nurse practitioners or physician assistants that have been trained to treat common illnesses and complaints. They're usually fairly quick and inexpensive. However, if you have serious medical issues or chronic medical problems, these are probably not your best option.  No Primary Care Doctor: - Call Health Connect at  [phone] - they can help you  locate a primary care doctor that  accepts your insurance, provides certain services, etc. - Physician Referral Service- [phone]  Chronic Pain Problems: Organization         Address  Phone   Notes  [facility]  [phone] Patients need to be referred by their primary care doctor.   Medication Assistance: Organization         Address  Phone   Notes  [facility] [address] [place], [place] [id number] [phone] --Must be a resident of [place] [facility] [place] -- Must have NO insurance coverage whatsoever (no Medicaid/ Medicare, etc.) -- The pt. MUST have a primary care doctor that directs their care regularly and follows them in the community   MedAssist  [phone]   [employer]  [phone]    Agencies that provide inexpensive medical care: Organization         Address  Phone   Notes  [facility]  [phone]   [facility]    [phone]   [facility] [address] [place], [place] [address] [phone]   [facility]  [address] [place] [phone]   Planned Parenthood    [phone]   [facility]    [phone]   [facility]  [address], [place] Phone:  [phone], Fax:  [phone] Hours of Operation:  9 am - 6 pm, M-F.  Also accepts Medicaid/Medicare and self-pay.  [facility]  [address], [place] Phone: [phone], Fax: [phone]. Hours of Operation:  8:30 am - 5:30 pm, M-F.  Also accepts Medicaid and self-pay.  [facility] [place] [address] [place] Phone: [phone]   [facility] [address] Natasha Bence [place], [place] [phone], Ext. [phone] Mondays & Thursdays: 7-9 AM.  First 15 patients are seen on a first come, first serve basis.    Medicaid-accepting [facility]  Providers:  Organization         Address  Phone   Notes  [facility] [address], [place] [phone] Also accepts self-pay patients.  [facility] [address] Laurell Josephs [place], [place]  [phone]   [facility] [address], [place] [phone]   [facility] [address] [place] [phone]   Renaye Rakers [address], [place]   [phone] Only accepts [place] Access [place] patients after they have their name applied to their card.   Self-Pay (no insurance) in [facility]:  Organization         Address  Phone   Notes  Sickle Cell Patients, [facility] Medicine [address] [place], [place] [phone]   [facility] [address] [place], [place] [phone]   [facility] [place]  [address], [place] [phone]   [facility]/Dr. Osei-Bonsu  [address] [place] or [address], [place] [phone] Phone number for both [place] and [place] locations is the same.  [facility] [address], [place] [phone]   [facility] [address], [place] or [address] [phone]   [facility] [address] [place] [phone], phone; [phone], fax Sees patients 1st and 3rd Saturday of every month.  Must not qualify for public or private insurance (i.e. Medicaid, Medicare, [place] Health Choice, Veterans' Benefits)  Household income should be no more than 200% of the poverty level The clinic cannot treat you if you are pregnant or think you are pregnant  Sexually transmitted diseases are not treated at the clinic.    Dental Care: Organization         Address  Phone  Notes  [facility] Department of [facility] [address] [place], [place] [phone] Accepts children up to age 65 who are enrolled in [place] or [place] Health Choice; pregnant women with a Medicaid card; and children who have applied for Medicaid or [place] Health Choice, but were declined, whose parents can pay a reduced fee at time of service.  [facility] of [facility]  [address], [place] [phone] Accepts children up to age 57 who are enrolled in [place] or [place] Health Choice; pregnant women with a Medicaid card; and children who have applied  for Medicaid or  Health Choice, but were declined, whose parents can pay a reduced fee at time of service.  [facility]  [address] [place], [place] [phone] Patients are seen by appointment only. Walk-ins are not accepted. [facility] will see patients 70 years of age and older. Monday - Tuesday (8am-5pm) Most Wednesdays (8:30-5pm) $30 per visit, cash only  [facility]  [address], [facility] [phone] Patients are seen by appointment only. Walk-ins are not accepted. [facility] will see patients 48 years of age and older. One Wednesday Evening (Monthly: Volunteer Based).  $30 per visit, cash only  [employer]  [phone] for adults; Children under age 55, call Graduate Pediatric Dentistry at [phone]. Children aged 32-14, please call [phone] to request a pediatric application.  Dental services are provided in all areas of dental care including fillings, crowns and bridges, complete and partial dentures, implants, gum treatment, root canals, and extractions. Preventive care is also provided. Treatment is provided to both adults and children. Patients are selected via a lottery and there is often a waiting list.   [facility] [address], [place]  [phone] [email or web address]   [facility]  [address] [place], [place] [phone], Ext. [phone] Second and Fourth Thursday of each month, opens at 6:30 AM; Clinic ends at 9 AM.  Patients are seen on a first-come first-served basis, and a limited number are seen during each clinic.   [facility]  [address] Ether Griffins [place], [place] [phone]   Eligibility Requirements You must have lived in [place], [place], or [place] counties for at least the last three months.   You cannot be eligible for state or federal sponsored [place], including [employer], [place], or [employer].   You generally cannot be eligible for healthcare insurance through your employer.    How to apply: Eligibility screenings are held every Tuesday and Wednesday afternoon from 1:00 pm until 4:00 pm. You do not need an appointment for the interview!  [facility] [address] [place], [place] [phone]   [facility]  [phone]   [facility] Health Department  [phone]   [facility] Health Department  [phone]    Behavioral Health Resources in the Community: Intensive Outpatient Programs Organization         Address  Phone  Notes  [facility] Services [address] [place], [place] [phone]   [facility] Outpatient [address] [place], [place] [phone]   [facility] [address] [place], [place]  [phone]   [facility] Mental Health [address],  [place], [place] [phone] or [phone]   Substance Abuse Resources Organization         Address  Phone  Notes  [facility] Drug Services  [phone]   [facility]  [phone]   [facility] [place]  [phone]   Floydene Flock  [phone]   Residential & Outpatient Substance Abuse Program  [phone]   Psychological Services Organization         Address  Phone  Notes  [facility] Health  [phone]     [facility]  [phone]   [facility] [address], [place] [phone] or [phone]    Mobile Crisis Teams Organization  Address  Phone  Notes  [facility], Mobile Crisis Care Unit  [phone]   [facility]  [address] [place], [place]   [facility] [address], [place] [phone]    Self-Help/Support Groups Organization         Address  Phone             Notes  [place] of [place] - variety of support groups  Ladd Call for more information  Narcotics Anonymous (NA), [facility] [address], [employer] [place]  2 meetings at this location   Special educational needs teacher         Address  Phone  Notes  [facility] [place],    Secretary  [phone]   [facility]  [address], [place] [address]6415, [place], [place]   [place], [place] [phone] Admissions: 8am-3pm M-F  Incentives Substance [place] [address],    [place], [place] [phone]   [facility] [address] [place], [place], [place]   [facility] [address],  [place], [place]   [facility] - Intensive Outpatient [address], [place], [place]   [facility] [place].,  [place], [place] [phone] or [phone]   [facility] ([facility]) [address] [place], [place] Accepts Medicaid  [facility] [place] [address]  [place] [phone] Substance Abuse/Addiction Treatment   [facility] Organization         Address  Phone  Notes  [facility]  [phone]   Domenic Schwab, PhD [address] Arlis Porta [place], [place]   [phone] or [phone]    [place], [place] [phone]   [facility] [address] [place], [place] [phone] Insurance/Medicaid/sponsorship through [facility] and Families [address]                                    [place], [place] [phone] [place] [address], [place] [phone]    Dr. Adele Schilder  [phone]   [facility]. 1) [address], [place] 2) [place] 3)  [address], [place] [phone]  [phone]   [place] [phone] or [phone] (After Hours)

## 2013-11-20 NOTE — ED Notes (Signed)
Pt monitored by 5-lead. Pt placed on 2LPM via [place].

## 2013-11-20 NOTE — ED Notes (Signed)
Patient returned from X-ray 

## 2013-11-20 NOTE — ED Notes (Signed)
PA at the bedside.

## 2013-11-20 NOTE — ED Provider Notes (Signed)
CSN: [id number]     Arrival date & time 11/20/13  1734 History   First MD Initiated Contact with Patient 11/20/13 1744     Chief Complaint  Patient presents with  . Shoulder Pain   Patient is a 23 y.o. male presenting with shoulder pain.  Shoulder Pain Associated symptoms include arthralgias, numbness and weakness. Pertinent negatives include no chills, fatigue or fever.    Patient is a 23 y.o. Male who presents to the ED with right shoulder pain.  Patient states that he was trying to put on a jacket when he felt a pop and experienced immediate pain in his shoulder and was unable to move it. Patient states that he has dislocated his shoulder many times before and feels that this feels exactly the same here.  Patient states that he was given a referral to the orthopedist the last time that he dislocated his shoulder and was told he likely needed surgery, but states that he doesn't have time to do that with his work.  Patient states that he does feel like his arm is numb and "dead like its asleep".    Past Medical History  Diagnosis Date  . Shoulder dislocation     Previous dislocstions have been on the right side.    History reviewed. No pertinent past surgical history. History reviewed. No pertinent family history. History  Substance Use Topics  . Smoking status: Current Every Day Smoker -- 0.50 packs/day    Types: Cigarettes  . Smokeless tobacco: Not on file  . Alcohol Use: Yes    Review of Systems  Constitutional: Negative for fever, chills and fatigue.  Musculoskeletal: Positive for arthralgias.  Neurological: Positive for weakness and numbness.  All other systems reviewed and are negative.     Allergies  Review of patient's allergies indicates no known allergies.  Home Medications   Prior to Admission medications   Not on File   BP 132/80  Pulse 72  Temp(Src) 97.8 F (36.6 C) (Oral)  Resp 19  SpO2 100% Physical Exam  Nursing note and vitals  reviewed. Constitutional: He appears well-developed and well-nourished. No distress.  HENT:  Head: Normocephalic and atraumatic.  Mouth/Throat: Oropharynx is clear and moist. No oropharyngeal exudate.  Eyes: Conjunctivae and EOM are normal. Pupils are equal, round, and reactive to light. No scleral icterus.  Neck: Normal range of motion. Neck supple. No JVD present. No thyromegaly present.  Cardiovascular: Normal rate, regular rhythm, normal heart sounds and intact distal pulses.  Exam reveals no gallop and no friction rub.   No murmur heard. Pulses:      Radial pulses are 2+ on the right side, and 2+ on the left side.  Pulmonary/Chest: Effort normal and breath sounds normal. No respiratory distress. He has no wheezes. He has no rales. He exhibits no tenderness.  Musculoskeletal:       Right shoulder: He exhibits decreased range of motion, tenderness and deformity. He exhibits no bony tenderness, no swelling, no effusion, no crepitus, no laceration, no pain, no spasm, normal pulse and normal strength.       Right hand: Normal sensation noted. Decreased sensation is not present in the ulnar distribution, is not present in the medial distribution and is not present in the radial distribution. Decreased strength noted. He exhibits no finger abduction, no thumb/finger opposition and no wrist extension trouble.  Right shoulder reveals positive sulcus sign with deformity.  There is tenderness to the palpation of the right shoulder of the joint.  Lymphadenopathy:    He has no cervical adenopathy.  Skin: He is not diaphoretic.    ED Course  Reduction of dislocation Date/Time: 11/20/2013 9:05 PM Performed by: Terri Piedra A Authorized by: Terri Piedra A Consent: Verbal consent obtained. written consent obtained. Risks and benefits: risks, benefits and alternatives were discussed Consent given by: patient Patient understanding: patient states understanding of the procedure being  performed Patient consent: the patient's understanding of the procedure matches consent given Procedure consent: procedure consent matches procedure scheduled Relevant documents: relevant documents present and verified Test results: test results available and properly labeled Site marked: the operative site was marked Imaging studies: imaging studies available Patient identity confirmed: verbally with patient and arm band Time out: Immediately prior to procedure a "time out" was called to verify the correct patient, procedure, equipment, support staff and site/side marked as required. Patient sedated: yes Sedation type: moderate (conscious) sedation Sedatives: ketamine and propofol (50 mg of propofol and 50 mg of ketamine) Sedation start date/time: 11/20/2013 8:00 PM Sedation end date/time: 11/20/2013 8:10 PM Vitals: Vital signs were monitored during sedation. Patient tolerance: Patient tolerated the procedure well with no immediate complications.   (including critical care time) Labs Review Labs Reviewed - No data to display  Imaging Review Dg Shoulder Right  11/20/2013   CLINICAL DATA:  Right shoulder pain.  EXAM: RIGHT SHOULDER - 2+ VIEW  COMPARISON:  May 19, 2013.  FINDINGS: Anterior dislocation of the proximal right humeral head is again noted. Hill-Sachs deformity is seen near the greater tuberosity. No definite fracture is noted.  IMPRESSION: Anterior dislocation of proximal right humeral head is noted.   Electronically Signed   By: Roque Lias M.D.   On: 11/20/2013 18:47     EKG Interpretation None      MDM   Final diagnoses:  Anterior shoulder dislocation, right, initial encounter   Patient is a 23 y.o. Male who presents to the ED with right shoulder pain.  Physical examination reveals positive sulcus sign with notable deformity.  Right arm and hand are neurovascularly intact at this time.  Xray reveals anterior dislocation.  Shoulder was reduced here as seen above.   Repeat film reveals successful reduction.  Patient was placed in sling and is to follow-up with ortho at this time for his multidirectional instability.  Patient to return for septic joint symptoms or further dislocations.  Patient is stable for discharge.  Will send home with a prescription for naproxen BID AC.  Patient was seen by and discussed with Dr. Effie Shy who agrees with the above workup and plan.       Eben Burow, PA-C 11/20/13 2139

## 2013-11-20 NOTE — ED Notes (Signed)
Pt presents to department for evaluation of possible R shoulder dislocation. States he "popped" shoulder out of socket this afternoon while putting on jacket. 10/10 pain at the time. Pt is alert and oriented x4.

## 2013-11-20 NOTE — ED Notes (Signed)
MD at the bedside  

## 2014-05-11 ENCOUNTER — Emergency Department (HOSPITAL_COMMUNITY)
Admission: EM | Admit: 2014-05-11 | Discharge: 2014-05-11 | Disposition: A | Discharge status: Home / Self Care | Payer: Medicaid Other | Attending: Emergency Medicine | Admitting: Emergency Medicine

## 2014-05-11 ENCOUNTER — Emergency Department (HOSPITAL_COMMUNITY): Payer: Medicaid Other

## 2014-05-11 ENCOUNTER — Encounter (HOSPITAL_COMMUNITY): Payer: Self-pay | Admitting: Emergency Medicine

## 2014-05-11 DIAGNOSIS — S91312A Laceration without foreign body, left foot, initial encounter: Secondary | ICD-10-CM | POA: Diagnosis present

## 2014-05-11 DIAGNOSIS — Y998 Other external cause status: Secondary | ICD-10-CM | POA: Diagnosis not present

## 2014-05-11 DIAGNOSIS — Z791 Long term (current) use of non-steroidal anti-inflammatories (NSAID): Secondary | ICD-10-CM | POA: Diagnosis not present

## 2014-05-11 DIAGNOSIS — S96922A Laceration of unspecified muscle and tendon at ankle and foot level, left foot, initial encounter: Secondary | ICD-10-CM | POA: Diagnosis not present

## 2014-05-11 DIAGNOSIS — W25XXXA Contact with sharp glass, initial encounter: Secondary | ICD-10-CM | POA: Insufficient documentation

## 2014-05-11 DIAGNOSIS — Z720 Tobacco use: Secondary | ICD-10-CM | POA: Diagnosis not present

## 2014-05-11 DIAGNOSIS — F419 Anxiety disorder, unspecified: Secondary | ICD-10-CM | POA: Insufficient documentation

## 2014-05-11 DIAGNOSIS — Y9389 Activity, other specified: Secondary | ICD-10-CM | POA: Diagnosis not present

## 2014-05-11 DIAGNOSIS — Y9289 Other specified places as the place of occurrence of the external cause: Secondary | ICD-10-CM | POA: Diagnosis not present

## 2014-05-11 MED ORDER — OXYCODONE-ACETAMINOPHEN 5-325 MG PO TABS
2.0000 | ORAL_TABLET | Freq: Once | ORAL | Status: AC
  Administered 2014-05-11: 2 via ORAL
  Filled 2014-05-11: qty 2

## 2014-05-11 MED ORDER — CEPHALEXIN 500 MG PO CAPS: 500.0000 mg | ORAL_CAPSULE | Freq: Four times a day (QID) | ORAL | Status: DC

## 2014-05-11 MED ORDER — OXYCODONE-ACETAMINOPHEN 5-325 MG PO TABS: 1.0000 | ORAL_TABLET | ORAL | Status: DC | PRN

## 2014-05-11 MED ORDER — LIDOCAINE-EPINEPHRINE 1 %-1:100000 IJ SOLN
10.0000 mL | Freq: Once | INTRAMUSCULAR | Status: AC
  Administered 2014-05-11: 10 mL via INTRADERMAL
  Filled 2014-05-11: qty 1
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
## 2014-05-11 NOTE — ED Notes (Signed)
In to check on patient.  Several small pools of blood found in the floor.  States I pulled the phone cord out of the wall so I was trying to plug it back in.  Encouraged to call for assistance.

## 2014-05-11 NOTE — ED Provider Notes (Addendum)
CSN: [id number]     Arrival date & time 05/11/14  0440 History   First MD Initiated Contact with Patient 05/11/14 0450     Chief Complaint  Patient presents with  . Extremity Laceration   Patient gave verbal permission to utilize photo for medical documentation only The image was not stored on any personal device   Patient is a 24 y.o. male presenting with skin laceration. The history is provided by the patient.  Laceration Location:  Foot Foot laceration location:  L foot Bleeding: controlled   Laceration mechanism:  Broken glass Pain details:    Quality:  Aching   Severity:  Moderate   Timing:  Constant   Progression:  Worsening Relieved by:  Nothing Worsened by:  Movement and pressure Tetanus status:  Unknown (patient refuses tetanus vaccination due to conspiracy theories) pt admits to drinking ETOH tonight He reports he stepped on broken vase at home and sustained laceration to left foot, plantar surface No other complaints He reports this was accident and was not an altercation  Past Medical History  Diagnosis Date  . Shoulder dislocation     Previous dislocstions have been on the right side.    History reviewed. No pertinent past surgical history. No family history on file. History  Substance Use Topics  . Smoking status: Current Every Day Smoker -- 0.50 packs/day    Types: Cigarettes  . Smokeless tobacco: Not on file  . Alcohol Use: Yes    Review of Systems  Constitutional: Negative for fever.  Gastrointestinal: Negative for vomiting and abdominal pain.  Musculoskeletal: Negative for back pain.  Skin: Positive for wound.  Neurological: Negative for weakness.  Psychiatric/Behavioral: The patient is nervous/anxious.   All other systems reviewed and are negative.     Allergies  Review of patient's allergies indicates no known allergies.  Home Medications   Prior to Admission medications   Medication Sig Start Date End Date Taking? Authorizing Provider   naproxen (NAPROSYN) 500 MG tablet Take 1 tablet (500 mg total) by mouth 2 (two) times daily. 11/20/13   Courtney Forcucci, PA-C   BP 127/82 mmHg  Pulse 83  Temp(Src) 98 F (36.7 C) (Oral)  Resp 16  SpO2 100% Physical Exam CONSTITUTIONAL:  Pt is intoxicated, he is anxious HEAD: Normocephalic/atraumatic EYES: EOMI/PERRL ENMT: Mucous membranes moist NECK: supple no meningeal signs SPINE/BACK:entire spine nontender CV: S1/S2 noted, no murmurs/rubs/gallops noted LUNGS: Lungs are clear to auscultation bilaterally, no apparent distress ABDOMEN: soft, nontender, no rebound or guarding, bowel sounds noted throughout abdomen GU:no cva tenderness NEURO: Pt is awake/alert/appropriate, moves all extremitiesx4.  No facial droop.   EXTREMITIES: pulses normal/equal, full ROM, he can fully flex/extend all toes on left foot, see photo below All other extremities/joints palpated/ranged and nontender SKIN: warm, color normal PSYCH: anxious      ED Course  Procedures   LACERATION REPAIR Performed by: Joya Gaskins Consent: Verbal consent obtained. Risks and benefits: risks, benefits and alternatives were discussed Patient identity confirmed: provided demographic data Time out performed prior to procedure Prepped and Draped in normal sterile fashion Wound explored Laceration Location: left foot plantar surface Laceration Length: 4cm No Foreign Bodies seen or palpated Anesthesia: local infiltration Local anesthetic: lidocaine 1% with epinephrine Anesthetic total: 8 ml Irrigation method: syringe Amount of cleaning: extensive - >765ml saline irrigated into wound.  Betadine to skin.  No foreign bodies noted in wound Skin closure: complex Number of sutures or staples: 7 prolene Technique: interrupted Patient tolerance: Patient tolerated the  procedure well with no immediate complications.  5:24 AM Imaging pending He refuses tetanus vaccination 7:33 AM Foot was cleansed extensively.   After thorough exploration there was no foreign body noted However it does appear deep structures/tendon have been violated I spoke to Dr Roda ShuttersXu with ortho he would like to see patient tomorrow Wound has been dressed by nursing.  He is on crutches and to remain NWB Will give oral pain meds/keflex Discussed strict return precautions   Imaging Review Dg Foot Complete Left  05/11/2014   CLINICAL DATA:  Plantar laceration, stepped on glass tonight.  EXAM: LEFT FOOT - COMPLETE 3+ VIEW  COMPARISON:  None.  FINDINGS: There is no evidence of fracture or dislocation. There is no evidence of arthropathy or other focal bone abnormality. Two linear tiny linear radiopaque foreign bodies within the mid plantar soft tissues within 1 cm of the skin surface.  IMPRESSION: Tiny radiopaque foreign bodies within the plantar soft tissues concerning for glass fragments without acute osseous process.   Electronically Signed   By: Awilda Metroourtnay  Bloomer   On: 05/11/2014 05:51    Medications  oxyCODONE-acetaminophen (PERCOCET/ROXICET) 5-325 MG per tablet 2 tablet (2 tablets Oral Given 05/11/14 0540)  lidocaine-EPINEPHrine (XYLOCAINE W/EPI) 1 %-1:100000 (with pres) injection 10 mL (10 mLs Intradermal Given 05/11/14 0641)     MDM   Final diagnoses:  Laceration of left foot with tendon involvement, initial encounter    Nursing notes including past medical history and social history reviewed and considered in documentation xrays/imaging reviewed by myself and considered during evaluation     Zadie Rhineonald Proctor Carriker, MD 05/11/14 [id number]  Zadie Rhineonald Kinzi Frediani, MD 05/11/14 [phone]

## 2014-05-11 NOTE — ED Notes (Signed)
Crutches provided.  Educated on use.  Able to maneuver very well with them.  Waiting for discharge instructions.

## 2014-06-09 ENCOUNTER — Emergency Department (HOSPITAL_COMMUNITY): Payer: Medicaid Other

## 2014-06-09 ENCOUNTER — Encounter (HOSPITAL_COMMUNITY): Payer: Self-pay | Admitting: Emergency Medicine

## 2014-06-09 ENCOUNTER — Emergency Department (HOSPITAL_COMMUNITY)
Admission: EM | Admit: 2014-06-09 | Discharge: 2014-06-09 | Disposition: A | Discharge status: Home / Self Care | Payer: Medicaid Other | Attending: Emergency Medicine | Admitting: Emergency Medicine

## 2014-06-09 DIAGNOSIS — Y998 Other external cause status: Secondary | ICD-10-CM | POA: Insufficient documentation

## 2014-06-09 DIAGNOSIS — Y9301 Activity, walking, marching and hiking: Secondary | ICD-10-CM | POA: Insufficient documentation

## 2014-06-09 DIAGNOSIS — W108XXA Fall (on) (from) other stairs and steps, initial encounter: Secondary | ICD-10-CM | POA: Insufficient documentation

## 2014-06-09 DIAGNOSIS — S4991XA Unspecified injury of right shoulder and upper arm, initial encounter: Secondary | ICD-10-CM | POA: Diagnosis present

## 2014-06-09 DIAGNOSIS — Z79899 Other long term (current) drug therapy: Secondary | ICD-10-CM | POA: Insufficient documentation

## 2014-06-09 DIAGNOSIS — S43004A Unspecified dislocation of right shoulder joint, initial encounter: Secondary | ICD-10-CM | POA: Insufficient documentation

## 2014-06-09 DIAGNOSIS — Z720 Tobacco use: Secondary | ICD-10-CM | POA: Insufficient documentation

## 2014-06-09 DIAGNOSIS — Y9289 Other specified places as the place of occurrence of the external cause: Secondary | ICD-10-CM | POA: Insufficient documentation

## 2014-06-09 MED ORDER — PROPOFOL 10 MG/ML IV BOLUS
1.0000 mg/kg | Freq: Once | INTRAVENOUS | Status: DC
  Filled 2014-06-09: qty 20

## 2014-06-09 MED ORDER — PROPOFOL 10 MG/ML IV BOLUS
INTRAVENOUS | Status: AC | PRN
  Administered 2014-06-09: 100 mg via INTRAVENOUS

## 2014-06-09 MED ORDER — KETAMINE HCL 10 MG/ML IJ SOLN: 1.0000 mg/kg | Freq: Once | INTRAMUSCULAR | Status: DC

## 2014-06-09 MED ORDER — NALOXONE HCL 0.4 MG/ML IJ SOLN
INTRAMUSCULAR | Status: AC
  Filled 2014-06-09: qty 1

## 2014-06-09 MED ORDER — OXYCODONE-ACETAMINOPHEN 5-325 MG PO TABS: 1.0000 | ORAL_TABLET | ORAL | Status: DC | PRN

## 2014-06-09 MED ORDER — FENTANYL CITRATE (PF) 100 MCG/2ML IJ SOLN
100.0000 ug | Freq: Once | INTRAMUSCULAR | Status: AC
  Administered 2014-06-09: 100 ug via INTRAVENOUS
  Filled 2014-06-09: qty 2

## 2014-06-09 MED ORDER — KETAMINE HCL 10 MG/ML IJ SOLN
INTRAMUSCULAR | Status: AC | PRN
  Administered 2014-06-09: 100 mg via INTRAVENOUS

## 2014-06-09 MED ORDER — FLUMAZENIL 0.5 MG/5ML IV SOLN
INTRAVENOUS | Status: AC
  Filled 2014-06-09: qty 5

## 2014-06-09 NOTE — ED Notes (Signed)
Spoke with pharmacy will be sending medication via tube system secured.

## 2014-06-09 NOTE — ED Notes (Signed)
History of right shoulder dislocation multiple times and today states walking down stairs tripped and right shoulder hit hand rail.  Pain currently 8-10/10 achy sharp. Last dislocation was 2 days and states last 10 times out shoulder back in place himself. Radial pulse +2

## 2014-06-09 NOTE — Sedation Documentation (Signed)
Sling applied by PA and RN

## 2014-06-09 NOTE — Discharge Instructions (Signed)
Take the prescribed medication as directed for pain.  Wear sling to prevent recurrent dislocation. Follow-up with orthopedics, Dr. Magnus IvanBlackman. Return to the ED for new or worsening symptoms.

## 2014-06-09 NOTE — ED Provider Notes (Signed)
CSN: [id number]     Arrival date & time 06/09/14  1312 History   First MD Initiated Contact with Patient 06/09/14 1314     Chief Complaint  Patient presents with  . Shoulder Injury     (Consider location/radiation/quality/duration/timing/severity/associated sxs/prior Treatment) Patient is a 24 y.o. male presenting with shoulder injury. The history is provided by the patient and medical records.  Shoulder Injury Associated symptoms include arthralgias.    This is a 24 year old male with history of recurrent right shoulder dislocation, presenting to the ED for another right shoulder dislocation. Patient states he was walking down the stairs, tripped, and his right shoulder hit the hand rail and dislocated. He denies any head injury or loss of consciousness. He states persistent pain and right shoulder with limited mobility since fall. States sensation is consistent with prior dislocations. States his last dislocation occurred 2 days ago he was able to replace it by himself. He denies any numbness or paresthesias of right arm. He states he is currently waiting to see orthopedics regarding his right shoulder.  Past Medical History  Diagnosis Date  . Shoulder dislocation     Previous dislocstions have been on the right side.    History reviewed. No pertinent past surgical history. No family history on file. History  Substance Use Topics  . Smoking status: Current Every Day Smoker -- 0.50 packs/day    Types: Cigarettes  . Smokeless tobacco: Not on file  . Alcohol Use: Yes    Review of Systems  Musculoskeletal: Positive for arthralgias.  All other systems reviewed and are negative.     Allergies  Review of patient's allergies indicates no known allergies.  Home Medications   Prior to Admission medications   Medication Sig Start Date End Date Taking? Authorizing Provider  cephALEXin (KEFLEX) 500 MG capsule Take 1 capsule (500 mg total) by mouth 4 (four) times daily. 05/11/14    Zadie Rhineonald Wickline, MD  oxyCODONE-acetaminophen (PERCOCET/ROXICET) 5-325 MG per tablet Take 1 tablet by mouth every 4 (four) hours as needed for severe pain. 05/11/14   Zadie Rhineonald Wickline, MD   BP 153/97 mmHg  Pulse 101  Temp(Src) 98.6 F (37 C) (Oral)  Resp 22  Ht 6\' 7"  (2.007 m)  Wt 185 lb (83.915 kg)  BMI 20.83 kg/m2  SpO2 97%   Physical Exam  Constitutional: He is oriented to person, place, and time. He appears well-developed and well-nourished.  HENT:  Head: Normocephalic and atraumatic.  Mouth/Throat: Oropharynx is clear and moist.  Eyes: Conjunctivae and EOM are normal. Pupils are equal, round, and reactive to light.  Neck: Normal range of motion.  Cardiovascular: Normal rate, regular rhythm and normal heart sounds.   Pulmonary/Chest: Effort normal and breath sounds normal.  Abdominal: Soft. Bowel sounds are normal.  Musculoskeletal: Normal range of motion.       Right shoulder: He exhibits deformity.  Right shoulder deformity consistent with anterior dislocation; normal grip strength; strong radial pulse and cap refill; normal sensation throughout arm  Neurological: He is alert and oriented to person, place, and time.  Skin: Skin is warm and dry.  Psychiatric: He has a normal mood and affect.  Nursing note and vitals reviewed.   ED Course  Reduction of dislocation Date/Time: 06/09/2014 2:20 PM Performed by: Garlon HatchetSANDERS, Zaydn Gutridge M Authorized by: Garlon HatchetSANDERS, Marbeth Smedley M Consent: Verbal consent obtained. Risks and benefits: risks, benefits and alternatives were discussed Consent given by: patient Patient understanding: patient states understanding of the procedure being performed Patient consent: the patient's  understanding of the procedure matches consent given Required items: required blood products, implants, devices, and special equipment available Patient identity confirmed: verbally with patient and arm band Time out: Immediately prior to procedure a "time out" was called to verify  the correct patient, procedure, equipment, support staff and site/side marked as required. Preparation: Patient was prepped and draped in the usual sterile fashion. Local anesthesia used: no Patient sedated: yes Sedation type: moderate (conscious) sedation Sedatives: ketamine and propofol Analgesia: fentanyl Sedation start date/time: 06/09/2014 2:15 PM Sedation end date/time: 06/09/2014 2:21 PM Vitals: Vital signs were monitored during sedation. Patient tolerance: Patient tolerated the procedure well with no immediate complications   (including critical care time) Labs Review Labs Reviewed - No data to display  Imaging Review Dg Shoulder Right Port  06/09/2014   CLINICAL DATA:  Status post reduction of right shoulder dislocation.  EXAM: PORTABLE RIGHT SHOULDER - 2+ VIEW  COMPARISON:  November 20, 2013.  FINDINGS: There is no evidence of fracture or dislocation. There is no evidence of arthropathy. Soft tissues are unremarkable.  IMPRESSION: No fracture or dislocation is seen currently.   Electronically Signed   By: Lupita Raider, M.D.   On: 06/09/2014 14:58     EKG Interpretation None      MDM   Final diagnoses:  Shoulder dislocation, right, initial encounter   24 year old male with recurrent right shoulder dislocation. On exam, there is deformity noted about the right shoulder that is consistent with an anterior dislocation. Attempted to reduce without sedation, patient did not tolerate this. Patient was sedated with 200 of propofol and 100 of ketamine, shoulder reduced without difficulty.  Right shoulder placed in sling.   Post-reduction films confirm successful reduction.  Patient d/c home in shoulder sling, pain meds as needed.  FU with orthopedics.  Discussed plan with patient, he/she acknowledged understanding and agreed with plan of care.  Return precautions given for new or worsening symptoms.  Garlon Hatchet, PA-C 06/09/14 1502  Nelva Nay, MD 06/11/14 2138

## 2014-07-05 ENCOUNTER — Encounter (HOSPITAL_COMMUNITY): Payer: Self-pay | Admitting: *Deleted

## 2014-07-05 ENCOUNTER — Emergency Department (HOSPITAL_COMMUNITY)
Admission: EM | Admit: 2014-07-05 | Discharge: 2014-07-05 | Discharge status: Against Medical Advice | Payer: No Typology Code available for payment source

## 2014-07-05 ENCOUNTER — Emergency Department (HOSPITAL_COMMUNITY): Payer: Medicaid Other

## 2014-07-05 ENCOUNTER — Emergency Department (HOSPITAL_COMMUNITY)
Admission: EM | Admit: 2014-07-05 | Discharge: 2014-07-05 | Disposition: A | Discharge status: Home / Self Care | Payer: Medicaid Other | Attending: Emergency Medicine | Admitting: Emergency Medicine

## 2014-07-05 DIAGNOSIS — Y929 Unspecified place or not applicable: Secondary | ICD-10-CM | POA: Insufficient documentation

## 2014-07-05 DIAGNOSIS — W231XXA Caught, crushed, jammed, or pinched between stationary objects, initial encounter: Secondary | ICD-10-CM | POA: Diagnosis not present

## 2014-07-05 DIAGNOSIS — S62639B Displaced fracture of distal phalanx of unspecified finger, initial encounter for open fracture: Secondary | ICD-10-CM

## 2014-07-05 DIAGNOSIS — Y999 Unspecified external cause status: Secondary | ICD-10-CM | POA: Insufficient documentation

## 2014-07-05 DIAGNOSIS — Y9389 Activity, other specified: Secondary | ICD-10-CM | POA: Insufficient documentation

## 2014-07-05 DIAGNOSIS — S62634A Displaced fracture of distal phalanx of right ring finger, initial encounter for closed fracture: Secondary | ICD-10-CM | POA: Insufficient documentation

## 2014-07-05 DIAGNOSIS — Z720 Tobacco use: Secondary | ICD-10-CM | POA: Diagnosis not present

## 2014-07-05 MED ORDER — CEPHALEXIN 500 MG PO CAPS
500.0000 mg | ORAL_CAPSULE | Freq: Once | ORAL | Status: AC
  Administered 2014-07-05: 500 mg via ORAL
  Filled 2014-07-05: qty 1

## 2014-07-05 MED ORDER — OXYCODONE-ACETAMINOPHEN 5-325 MG PO TABS: 1.0000 | ORAL_TABLET | ORAL | Status: DC | PRN

## 2014-07-05 MED ORDER — CEPHALEXIN 500 MG PO CAPS: 500.0000 mg | ORAL_CAPSULE | Freq: Four times a day (QID) | ORAL | Status: DC

## 2014-07-05 NOTE — ED Notes (Signed)
Pt states he smashed his right middle finger while working on a motorcycle wheel. Pt's fingernail appears discolored and has blood around the edges. Pt's finger is swollen and tender to touch. 9/10 pain.

## 2014-07-05 NOTE — Discharge Instructions (Signed)
Finger Fracture  Fractures of fingers are breaks in the bones of the fingers. There are many types of fractures. There are different ways of treating these fractures. Your health care provider will discuss the best way to treat your fracture.  CAUSES  Traumatic injury is the main cause of broken fingers. These include:   Injuries while playing sports.   Workplace injuries.   Falls.  RISK FACTORS  Activities that can increase your risk of finger fractures include:   Sports.   Workplace activities that involve machinery.   A condition called osteoporosis, which can make your bones less dense and cause them to fracture more easily.  SIGNS AND SYMPTOMS  The main symptoms of a broken finger are pain and swelling within 15 minutes after the injury. Other symptoms include:   Bruising of your finger.   Stiffness of your finger.   Numbness of your finger.   Exposed bones (compound fracture) if the fracture is severe.  DIAGNOSIS   The best way to diagnose a broken bone is with X-ray imaging. Additionally, your health care provider will use this X-ray image to evaluate the position of the broken finger bones.   TREATMENT   Finger fractures can be treated with:    Nonreduction--This means the bones are in place. The finger is splinted without changing the positions of the bone pieces. The splint is usually left on for about a week to 10 days. This will depend on your fracture and what your health care provider thinks.   Closed reduction--The bones are put back into position without using surgery. The finger is then splinted.   Open reduction and internal fixation--The fracture site is opened. Then the bone pieces are fixed into place with pins or some type of hardware. This is seldom required. It depends on the severity of the fracture.  HOME CARE INSTRUCTIONS    Follow your health care provider's instructions regarding activities, exercises, and physical therapy.   Only take over-the-counter or prescription  medicines for pain, discomfort, or fever as directed by your health care provider.  SEEK MEDICAL CARE IF:  You have pain or swelling that limits the motion or use of your fingers.  SEEK IMMEDIATE MEDICAL CARE IF:   Your finger becomes numb.  MAKE SURE YOU:    Understand these instructions.   Will watch your condition.   Will get help right away if you are not doing well or get worse.  Document Released: 05/25/2000 Document Revised: 12/01/2012 Document Reviewed: 09/22/2012  [employer] Patient Information 2015 [employer]. This information is not intended to replace advice given to you by your health care provider. Make sure you discuss any questions you have with your health care provider.      RICE: Routine Care for Injuries  The routine care of many injuries includes Rest, Ice, Compression, and Elevation (RICE).  HOME CARE INSTRUCTIONS   Rest is needed to allow your body to heal. Routine activities can usually be resumed when comfortable. Injured tendons and bones can take up to 6 weeks to heal. Tendons are the cord-like structures that attach muscle to bone.   Ice following an injury helps keep the swelling down and reduces pain.   Put ice in a plastic bag.   Place a towel between your skin and the bag.   Leave the ice on for 15-20 minutes, 3-4 times a day, or as directed by your health care provider. Do this while awake, for the first 24 to 48 hours.   After that, continue as directed by your caregiver.   Compression helps keep swelling down. It also gives support and helps with discomfort. If an elastic bandage has been applied, it should be removed and reapplied every 3 to 4 hours. It should not be applied tightly, but firmly enough to keep swelling down. Watch fingers or toes for swelling, bluish discoloration, coldness, numbness, or excessive pain. If any of these problems occur, remove the bandage and reapply loosely. Contact your caregiver if these problems continue.   Elevation helps reduce  swelling and decreases pain. With extremities, such as the arms, hands, legs, and feet, the injured area should be placed near or above the level of the heart, if possible.  SEEK IMMEDIATE MEDICAL CARE IF:   You have persistent pain and swelling.   You develop redness, numbness, or unexpected weakness.   Your symptoms are getting worse rather than improving after several days.  These symptoms may indicate that further evaluation or further X-rays are needed. Sometimes, X-rays may not show a small broken bone (fracture) until 1 week or 10 days later. Make a follow-up appointment with your caregiver. Ask when your X-ray results will be ready. Make sure you get your X-ray results.  Document Released: 05/25/2000 Document Revised: 02/15/2013 Document Reviewed: 07/12/2010  [employer] Patient Information 2015 [employer]. This information is not intended to replace advice given to you by your health care provider. Make sure you discuss any questions you have with your health care provider.

## 2014-07-05 NOTE — ED Provider Notes (Signed)
CSN: [id number]     Arrival date & time 07/05/14  1814 History  This chart was scribed for Antony Madura, PA-C, working with Richardean Canal, MD by Chestine Spore, ED Scribe. The patient was seen in room WTR5/WTR5 at 8:11 PM.     Chief Complaint  Patient presents with  . Finger Injury    The history is provided by the patient. No language interpreter was used.    Ryan Guerra is a 24 y.o. male with no significant PMH, who presents to the Emergency department complaining of right middle finger injury onset tonight PTA. Pt reports that he smashed his right middle finger while working on a motorcycle wheel. Pt finger was initially black and the nail was white, he notes that now the black discoloration has lifted some. Pt notes that the pain has gotten better since the incident occurred. Pt right middle finger pain is intermittent, sharp, throbbing, and it radiates to the other fingers on his right hand. Pt notes that when he has wounds that they take a long time to heal and he voices concern for if that will occur now. He states that he has not tried any medications for the relief of his symptoms. He states that he is having associated symptoms of joint swelling and color change.  He denies any other symptoms. Pt broke his right wrist and saw a hand specialist 6 years ago, whom he does not remember. Pt will be going to [facility] for his shoulder issues and would like to be sent there for his right middle finger injury. Denies allergies to antibiotics. Pt is not UTD on his tetanus immunization.    Past Medical History  Diagnosis Date  . Shoulder dislocation     Previous dislocstions have been on the right side.    History reviewed. No pertinent past surgical history. No family history on file. History  Substance Use Topics  . Smoking status: Current Every Day Smoker -- 0.50 packs/day    Types: Cigarettes  . Smokeless tobacco: Not on file  . Alcohol Use: Yes    Review of Systems   Constitutional: Negative for fever and chills.  Musculoskeletal: Positive for joint swelling and arthralgias. Negative for myalgias.  Skin: Positive for color change and wound.  Neurological: Negative for weakness.  All other systems reviewed and are negative.     Allergies  Review of patient's allergies indicates no known allergies.  Home Medications   Prior to Admission medications   Medication Sig Start Date End Date Taking? Authorizing Provider  cephALEXin (KEFLEX) 500 MG capsule Take 1 capsule (500 mg total) by mouth 4 (four) times daily. 07/05/14   Antony Madura, PA-C  oxyCODONE-acetaminophen (PERCOCET/ROXICET) 5-325 MG per tablet Take 1 tablet by mouth every 4 (four) hours as needed. 07/05/14   Antony Madura, PA-C   BP 128/75 mmHg  Pulse 74  Temp(Src) 97.7 F (36.5 C) (Oral)  Resp 18  SpO2 98%  Physical Exam  Constitutional: He is oriented to person, place, and time. He appears well-developed and well-nourished. No distress.  HENT:  Head: Normocephalic and atraumatic.  Eyes: Conjunctivae and EOM are normal. No scleral icterus.  Neck: Normal range of motion.  Cardiovascular: Normal rate, regular rhythm and intact distal pulses.   Distal radial pulse 2+ in the right upper extremity. Capillary refill brisk in all digits of right hand.  Pulmonary/Chest: Effort normal. No respiratory distress.  Musculoskeletal: Normal range of motion.       Right hand:  He exhibits tenderness, bony tenderness and swelling (mild). He exhibits normal range of motion, normal two-point discrimination and normal capillary refill. Normal sensation noted. Normal strength noted.       Hands: TTP with mild swelling to distal R 3rd digit. There is a mild subungual hematoma as well as a skin tear at the base of the nail of the R 3rd digit. No bony exposure. Normal ROM of R 3rd digit with normal strength against resistance of FDP, FDS, and extensors.  Neurological: He is alert and oriented to person, place,  and time. He exhibits normal muscle tone. Coordination normal.  Sensation to light touch intact in distal tips of all fingers.  Skin: Skin is warm and dry. No rash noted. He is not diaphoretic. No erythema. No pallor.  Psychiatric: He has a normal mood and affect. His behavior is normal.  Nursing note and vitals reviewed.   ED Course  Procedures (including critical care time) DIAGNOSTIC STUDIES: Oxygen Saturation is 98% on RA, nl by my interpretation.    COORDINATION OF CARE: 8:21 PM-Discussed treatment plan which includes right hand complete X-ray, f/u with hand specialist, abx Rx, finger splint, pain medication Rx, Ice with pt at bedside and pt agreed to plan.   8:25 PM- Pt denies tetanus injection at this time and the pt has the capacity to make this decision. He knows that the consequences of declining this shot may lead to adverse outcomes such as the development of tetanus or the possibility of death. He also declines nail cautery.  Labs Review Labs Reviewed - No data to display  Imaging Review Dg Hand Complete Right  07/05/2014   CLINICAL DATA:  Right third finger injury while working on motorcycle wheel. Initial encounter.  EXAM: RIGHT HAND - COMPLETE 3+ VIEW  COMPARISON:  None.  FINDINGS: Mildly displaced fracture is seen involving the distal tuft of the third distal phalanx. This appears to be closed and posttraumatic. Joint spaces are intact. No soft tissue abnormality or radiopaque foreign body is noted.  IMPRESSION: Mildly displaced fracture of distal tuft of third distal phalanx.   Electronically Signed   By: Lupita RaiderJames  Green Jr, M.D.   On: 07/05/2014 19:34     EKG Interpretation None      MDM   Final diagnoses:  Phalanx, distal fracture of finger, open, initial encounter    24 year old male presents to the emergency department for further evaluation of pain to his right third digit after a motorcycle fell on it. He is neurovascularly intact. X-ray shows a mildly  displaced fracture of the distal tuft of the third distal phalanx. Unable to determine whether this is an open fracture, but there appears to be no evidence of this on my exam. Unable to visualize beneath the nailbed for open injury. Patient to be placed on Keflex to cover for infection. Have also with revised RICE and NSAIDs. Percocet given for pain. Patient to f/u with a hand specialist to ensure proper wound healing. He declines a tetanus shot as well as any nail cauterization today. Return precautions discussed and provided. Patient agreeable to plan with no unaddressed concerns. Patient discharged in good condition.  I personally performed the services described in this documentation, which was scribed in my presence. The recorded information has been reviewed and is accurate.   Filed Vitals:   07/05/14 1847 07/05/14 2052  BP: 128/75 130/70  Pulse: 74 75  Temp: 97.7 F (36.5 C)   TempSrc: Oral   Resp: 18 18  SpO2: 98% 98%      Antony MaduraKelly Jaid Quirion, PA-C 07/05/14 2106  Richardean Canalavid H Yao, MD 07/08/14 [phone]

## 2014-11-18 ENCOUNTER — Emergency Department (HOSPITAL_COMMUNITY)
Admission: EM | Admit: 2014-11-18 | Discharge: 2014-11-19 | Disposition: A | Discharge status: Home / Self Care | Payer: Medicaid Other | Attending: Emergency Medicine | Admitting: Emergency Medicine

## 2014-11-18 DIAGNOSIS — X58XXXA Exposure to other specified factors, initial encounter: Secondary | ICD-10-CM | POA: Insufficient documentation

## 2014-11-18 DIAGNOSIS — Z720 Tobacco use: Secondary | ICD-10-CM | POA: Insufficient documentation

## 2014-11-18 DIAGNOSIS — F1310 Sedative, hypnotic or anxiolytic abuse, uncomplicated: Secondary | ICD-10-CM | POA: Diagnosis not present

## 2014-11-18 DIAGNOSIS — Y9289 Other specified places as the place of occurrence of the external cause: Secondary | ICD-10-CM | POA: Diagnosis not present

## 2014-11-18 DIAGNOSIS — R51 Headache: Secondary | ICD-10-CM | POA: Diagnosis not present

## 2014-11-18 DIAGNOSIS — Y998 Other external cause status: Secondary | ICD-10-CM | POA: Diagnosis not present

## 2014-11-18 DIAGNOSIS — T40601A Poisoning by unspecified narcotics, accidental (unintentional), initial encounter: Secondary | ICD-10-CM

## 2014-11-18 DIAGNOSIS — Y9389 Activity, other specified: Secondary | ICD-10-CM | POA: Insufficient documentation

## 2014-11-18 DIAGNOSIS — T400X1A Poisoning by opium, accidental (unintentional), initial encounter: Secondary | ICD-10-CM | POA: Diagnosis present

## 2014-11-18 DIAGNOSIS — F1410 Cocaine abuse, uncomplicated: Secondary | ICD-10-CM | POA: Diagnosis not present

## 2014-11-18 LAB — COMPREHENSIVE METABOLIC PANEL
ALT: 26 U/L (ref 17–63)
AST: 34 U/L (ref 15–41)
Albumin: 4.6 g/dL (ref 3.5–5.0)
Alkaline Phosphatase: 77 U/L (ref 38–126)
Anion gap: 11 (ref 5–15)
BILIRUBIN TOTAL: 0.7 mg/dL (ref 0.3–1.2)
BUN: 16 mg/dL (ref 6–20)
CALCIUM: 9.1 mg/dL (ref 8.9–10.3)
CO2: 23 mmol/L (ref 22–32)
Chloride: 102 mmol/L (ref 101–111)
Creatinine, Ser: 1.09 mg/dL (ref 0.61–1.24)
GFR calc Af Amer: >60 mL/min (ref >60)
Glucose, Bld: 175 mg/dL — ABNORMAL HIGH (ref 65–99)
POTASSIUM: 3.7 mmol/L (ref 3.5–5.1)
Sodium: 136 mmol/L (ref 135–145)
TOTAL PROTEIN: 7.5 g/dL (ref 6.5–8.1)

## 2014-11-18 LAB — CBC WITH DIFFERENTIAL/PLATELET
BASOS ABS: 0 10*3/uL (ref 0.0–0.1)
BASOS PCT: 0 %
EOS ABS: 0 10*3/uL (ref 0.0–0.7)
Eosinophils Relative: 0 %
HCT: 43.4 % (ref 39.0–52.0)
Hemoglobin: 15.2 g/dL (ref 13.0–17.0)
Lymphocytes Relative: 7 %
Lymphs Abs: 1.4 10*3/uL (ref 0.7–4.0)
MCH: 30.3 pg (ref 26.0–34.0)
MCHC: 35 g/dL (ref 30.0–36.0)
MCV: 86.5 fL (ref 78.0–100.0)
MONO ABS: 1.2 10*3/uL — AB (ref 0.1–1.0)
Monocytes Relative: 6 %
NEUTROS PCT: 87 %
Neutro Abs: 17.6 10*3/uL — ABNORMAL HIGH (ref 1.7–7.7)
Platelets: 187 10*3/uL (ref 150–400)
RBC: 5.02 MIL/uL (ref 4.22–5.81)
RDW: 12.3 % (ref 11.5–15.5)
WBC: 20.2 10*3/uL — ABNORMAL HIGH (ref 4.0–10.5)

## 2014-11-18 LAB — ETHANOL: Alcohol, Ethyl (B): <5 mg/dL (ref <5)

## 2014-11-18 MED ORDER — ONDANSETRON HCL 4 MG/2ML IJ SOLN
4.0000 mg | Freq: Once | INTRAMUSCULAR | Status: AC
  Administered 2014-11-18: 4 mg via INTRAVENOUS
  Filled 2014-11-18: qty 2

## 2014-11-18 NOTE — ED Notes (Signed)
MD at bedside. 

## 2014-11-18 NOTE — ED Provider Notes (Signed)
CSN: [id number]     Arrival date & time 11/18/14  2201 History   First MD Initiated Contact with Patient 11/18/14 2217     Chief Complaint  Patient presents with  . Drug Overdose     (Consider location/radiation/quality/duration/timing/severity/associated sxs/prior Treatment) Patient is a 24 y.o. male presenting with Overdose. The history is provided by the patient.  Drug Overdose This is a new problem. Associated symptoms include headaches. Pertinent negatives include no chest pain, no abdominal pain and no shortness of breath.   patient was reportedly found unresponsive by family members. Had gotten a brown pill from his friend and snorted it. He thinks it may have been heroin. Given EMS and fire Narcan. He reportedly had CPR done by family members. Now complaining of nausea and a headache. States this was the first time he done any opiates. States he will occasionally drink some alcohol. Denies drinking today. Denies other drug use. Denies suicide attempt. Denies depression.  Past Medical History  Diagnosis Date  . Shoulder dislocation     Previous dislocstions have been on the right side.    No past surgical history on file. No family history on file. Social History  Substance Use Topics  . Smoking status: Current Every Day Smoker -- 0.50 packs/day    Types: Cigarettes  . Smokeless tobacco: Not on file  . Alcohol Use: Yes    Review of Systems  Constitutional: Negative for activity change and appetite change.  Eyes: Positive for photophobia. Negative for pain.  Respiratory: Negative for chest tightness and shortness of breath.   Cardiovascular: Negative for chest pain and leg swelling.  Gastrointestinal: Negative for nausea, vomiting, abdominal pain and diarrhea.  Genitourinary: Negative for flank pain.  Musculoskeletal: Negative for back pain and neck stiffness.  Skin: Negative for rash.  Neurological: Positive for headaches. Negative for weakness and numbness.   Psychiatric/Behavioral: Negative for behavioral problems.      Allergies  Review of patient's allergies indicates no known allergies.  Home Medications   Prior to Admission medications   Medication Sig Start Date End Date Taking? Authorizing Provider  cephALEXin (KEFLEX) 500 MG capsule Take 1 capsule (500 mg total) by mouth 4 (four) times daily. Patient not taking: Reported on 11/18/2014 07/05/14   Antony Madura, PA-C  oxyCODONE-acetaminophen (PERCOCET/ROXICET) 5-325 MG per tablet Take 1 tablet by mouth every 4 (four) hours as needed. Patient not taking: Reported on 11/18/2014 07/05/14   Antony Madura, PA-C   BP 116/78 mmHg  Pulse 98  Temp(Src) 98.2 F (36.8 C) (Oral)  Resp 18  SpO2 98% Physical Exam  Constitutional: He is oriented to person, place, and time. He appears well-developed and well-nourished.  HENT:  Head: Normocephalic and atraumatic.  Eyes: EOM are normal. Pupils are equal, round, and reactive to light.  Neck: Normal range of motion. Neck supple.  Cardiovascular: Normal rate, regular rhythm and normal heart sounds.   No murmur heard. Pulmonary/Chest: Effort normal and breath sounds normal.  Abdominal: Soft. Bowel sounds are normal. He exhibits no distension. There is no tenderness.  Musculoskeletal: Normal range of motion. He exhibits no edema.  Neurological: He is alert and oriented to person, place, and time. No cranial nerve deficit.  Patient is somewhat sedate but awake and appropriate.  Skin: Skin is warm and dry.  Psychiatric: He has a normal mood and affect.  Nursing note and vitals reviewed.   ED Course  Procedures (including critical care time) Labs Review Labs Reviewed  COMPREHENSIVE METABOLIC PANEL -  Abnormal; Notable for the following:    Glucose, Bld 175 (*)    All other components within normal limits  URINE RAPID DRUG SCREEN, HOSP PERFORMED - Abnormal; Notable for the following:    Opiates POSITIVE (*)    Cocaine POSITIVE (*)     Benzodiazepines POSITIVE (*)    All other components within normal limits  CBC WITH DIFFERENTIAL/PLATELET - Abnormal; Notable for the following:    WBC 20.2 (*)    Neutro Abs 17.6 (*)    Monocytes Absolute 1.2 (*)    All other components within normal limits  ETHANOL    Imaging Review No results found. I have personally reviewed and evaluated these images and lab results as part of my medical decision-making.   EKG Interpretation   Date/Time:  Saturday November 18 2014 22:15:30 EDT Ventricular Rate:  74 PR Interval:  121 QRS Duration: 117 QT Interval:  419 QTC Calculation: 465 R Axis:   53 Text Interpretation:  Sinus rhythm Nonspecific intraventricular conduction  delay Confirmed by Rubin Payor  MD, Harrold Donath [phone]) on 11/18/2014 10:21:25 PM      MDM   Final diagnoses:  Opiate overdose, accidental or unintentional, initial encounter    Patient opiate overdose. Required Narcan to reverse. Denies other drug use but drug screen is positive for multiple substances. Denies suicide attempt. Will allow for sobriety    Benjiman Core, MD 11/21/14 [phone]

## 2014-11-18 NOTE — Discharge Instructions (Signed)
Narcotic Overdose  A narcotic overdose is the misuse or overuse of a narcotic drug. A narcotic overdose can make you pass out and stop breathing. If you are not treated right away, this can cause permanent brain damage or stop your heart. Medicine may be given to reverse the effects of an overdose. If so, this medicine may bring on withdrawal symptoms. The symptoms may be abdominal cramps, throwing up (vomiting), sweating, chills, and nervousness.  Injecting narcotics can cause more problems than just an overdose. AIDS, hepatitis, and other very serious infections are transmitted by sharing needles and syringes. If you decide to quit using, there are medicines which can help you through the withdrawal period. Trying to quit all at once on your own can be uncomfortable, but not life-threatening. Call your caregiver, Narcotics Anonymous, or any drug and alcohol treatment program for further help.   Document Released: 03/20/2004 Document Revised: 05/05/2011 Document Reviewed: 01/12/2009  ExitCare® Patient Information ©2015 [employer]. This information is not intended to replace advice given to you by your health care provider. Make sure you discuss any questions you have with your health care provider.

## 2014-11-18 NOTE — ED Notes (Signed)
Per EMS pt states he had a little tan/brown pill that he crushed and snorted  Pt states it was heroin  Family performed CPR on pt prior to arrival  Fire arrived and gave Narcan  intranasally  Fire place a OPA and assisted with ventilation  EMS arrived and placed an IV and gave Narcan 2 mg IV, removed the OPA and placed on NRBM which was removed prior to arrival  Pt was also given Zofran  IV x 2 doses  Pt is alert and oriented upon arrival to the hospital

## 2014-11-19 LAB — RAPID URINE DRUG SCREEN, HOSP PERFORMED
AMPHETAMINES: NOT DETECTED
Barbiturates: NOT DETECTED
Benzodiazepines: POSITIVE — AB
Cocaine: POSITIVE — AB
Opiates: POSITIVE — AB
TETRAHYDROCANNABINOL: NOT DETECTED

## 2014-11-19 NOTE — ED Provider Notes (Signed)
This chart was scribed for Devoria Albe, MD by Doreatha Martin, ED Scribe. This patient was seen in room WA09/WA09 and the patient's care was started at 1:46 AM.    Patient was left at change of shift to be reevaluated around 1 AM after probable heroin overdose. Bystander CPR had been initiated. He received Narcan and responded to that.  Pt reevaluated, who reports feeling better. Precautions of street drug use were discussed with him. Pt is alert, appropriate, in no distress and breathing is normal. Patient states he has someone to take him home.  I personally performed the services described in this documentation, which was scribed in my presence. The recorded information has been reviewed and considered.  Devoria Albe, MD, Concha Pyo, MD 11/19/14 [phone]

## 2015-01-23 ENCOUNTER — Emergency Department (INDEPENDENT_AMBULATORY_CARE_PROVIDER_SITE_OTHER)
Admission: EM | Admit: 2015-01-23 | Discharge: 2015-01-23 | Disposition: A | Discharge status: Home / Self Care | Payer: Medicaid Other | Source: Home / Self Care

## 2015-01-23 ENCOUNTER — Encounter (HOSPITAL_COMMUNITY): Payer: Self-pay

## 2015-01-23 ENCOUNTER — Other Ambulatory Visit (HOSPITAL_COMMUNITY)
Admission: RE | Admit: 2015-01-23 | Discharge: 2015-01-23 | Disposition: A | Discharge status: Home / Self Care | Payer: Medicaid Other | Source: Ambulatory Visit | Attending: Family Medicine | Admitting: Family Medicine

## 2015-01-23 DIAGNOSIS — A64 Unspecified sexually transmitted disease: Secondary | ICD-10-CM | POA: Diagnosis not present

## 2015-01-23 DIAGNOSIS — Z113 Encounter for screening for infections with a predominantly sexual mode of transmission: Secondary | ICD-10-CM | POA: Diagnosis present

## 2015-01-23 MED ORDER — AZITHROMYCIN 250 MG PO TABS
1000.0000 mg | ORAL_TABLET | Freq: Once | ORAL | Status: AC
  Administered 2015-01-23: 1000 mg via ORAL

## 2015-01-23 MED ORDER — LIDOCAINE HCL (PF) 1 % IJ SOLN
INTRAMUSCULAR | Status: AC
  Filled 2015-01-23: qty 5

## 2015-01-23 MED ORDER — CEFTRIAXONE SODIUM 250 MG IJ SOLR
250.0000 mg | Freq: Once | INTRAMUSCULAR | Status: AC
  Administered 2015-01-23: 250 mg via INTRAMUSCULAR

## 2015-01-23 MED ORDER — CEFTRIAXONE SODIUM 250 MG IJ SOLR
INTRAMUSCULAR | Status: AC
  Filled 2015-01-23: qty 250

## 2015-01-23 MED ORDER — AZITHROMYCIN 250 MG PO TABS
ORAL_TABLET | ORAL | Status: AC
  Filled 2015-01-23: qty 4

## 2015-01-23 NOTE — ED Notes (Signed)
Denies any signs or symptoms of allergic reaction at release; call back number verified

## 2015-01-23 NOTE — ED Provider Notes (Signed)
CSN: [id number]     Arrival date & time 01/23/15  1752 History   None    Chief Complaint  Patient presents with  . Exposure to STD   (Consider location/radiation/quality/duration/timing/severity/associated sxs/prior Treatment) HPI  Past Medical History  Diagnosis Date  . Shoulder dislocation     Previous dislocstions have been on the right side.    History reviewed. No pertinent past surgical history. No family history on file. Social History  Substance Use Topics  . Smoking status: Current Every Day Smoker -- 0.50 packs/day    Types: Cigarettes  . Smokeless tobacco: None  . Alcohol Use: Yes    Review of Systems ROS +'ve STD exposure  Denies: HEADACHE, NAUSEA, ABDOMINAL PAIN, CHEST PAIN, CONGESTION, DYSURIA, SHORTNESS OF BREATH  Allergies  Review of patient's allergies indicates no known allergies.  Home Medications   Prior to Admission medications   Medication Sig Start Date End Date Taking? Authorizing Provider  cephALEXin (KEFLEX) 500 MG capsule Take 1 capsule (500 mg total) by mouth 4 (four) times daily. Patient not taking: Reported on 11/18/2014 07/05/14   Antony MaduraKelly Humes, PA-C  oxyCODONE-acetaminophen (PERCOCET/ROXICET) 5-325 MG per tablet Take 1 tablet by mouth every 4 (four) hours as needed. Patient not taking: Reported on 11/18/2014 07/05/14   Antony MaduraKelly Humes, PA-C   Meds Ordered and Administered this Visit   Medications  cefTRIAXone (ROCEPHIN) injection 250 mg (not administered)  azithromycin (ZITHROMAX) tablet 1,000 mg (not administered)    BP 127/79 mmHg  Pulse 69  Temp(Src) 97.6 F (36.4 C) (Oral)  Resp 18  SpO2 98% No data found.   Physical Exam  Constitutional: He appears well-developed and well-nourished.  HENT:  Head: Normocephalic and atraumatic.  Pulmonary/Chest: Effort normal.  Genitourinary: Penis normal.  Musculoskeletal: Normal range of motion.  Neurological: He is alert.  Skin: Skin is warm and dry.  Psychiatric: He has a normal mood and  affect. His behavior is normal. Judgment and thought content normal.    ED Course  Procedures (including critical care time)  Labs Review Labs Reviewed  URINE CYTOLOGY ANCILLARY ONLY    Imaging Review No results found.   Visual Acuity Review  Right Eye Distance:   Left Eye Distance:   Bilateral Distance:    Right Eye Near:   Left Eye Near:    Bilateral Near:         MDM   1. STD (male)    Pt states he has symptoms of chlamydia Treated with ceftriaxone/azith Urine culture pending    Tharon AquasFrank C Semisi Biela, GeorgiaPA 01/23/15 1859

## 2015-01-23 NOTE — Discharge Instructions (Signed)
Sexually Transmitted Disease A sexually transmitted disease (STD) is a disease or infection often passed to another person during sex. However, STDs can be passed through nonsexual ways. An STD can be passed through:  Spit (saliva).  Semen.  Blood.  Mucus from the vagina.  Pee (urine). HOW CAN I LESSEN MY CHANCES OF GETTING AN STD?  Use:  Latex condoms.  Water-soluble lubricants with condoms. Do not use petroleum jelly or oils.  Dental dams. These are small pieces of latex that are used as a barrier during oral sex.  Avoid having more than one sex partner.  Do not have sex with someone who has other sex partners.  Do not have sex with anyone you do not know or who is at high risk for an STD.  Avoid risky sex that can break your skin.  Do not have sex if you have open sores on your mouth or skin.  Avoid drinking too much alcohol or taking illegal drugs. Alcohol and drugs can affect your good judgment.  Avoid oral and anal sex acts.  Get shots (vaccines) for HPV and hepatitis.  If you are at risk of being infected with HIV, it is advised that you take a certain medicine daily to prevent HIV infection. This is called pre-exposure prophylaxis (PrEP). You may be at risk if:  You are a man who has sex with other men (MSM).  You are attracted to the opposite sex (heterosexual) and are having sex with more than one partner.  You take drugs with a needle.  You have sex with someone who has HIV.  Talk with your doctor about if you are at high risk of being infected with HIV. If you begin to take PrEP, get tested for HIV first. Get tested every 3 months for as long as you are taking PrEP.  Get tested for STDs every year if you are sexually active. If you are treated for an STD, get tested again 3 months after you are treated. WHAT SHOULD I DO IF I THINK I HAVE AN STD?  See your doctor.  Tell your sex partner(s) that you have an STD. They should be tested and treated.  Do  not have sex until your doctor says it is okay. WHEN SHOULD I GET HELP? Get help right away if:  You have bad belly (abdominal) pain.  You are a man and have puffiness (swelling) or pain in your testicles.  You are a woman and have puffiness in your vagina.   This information is not intended to replace advice given to you by your health care provider. Make sure you discuss any questions you have with your health care provider.   Document Released: 03/20/2004 Document Revised: 03/03/2014 Document Reviewed: 08/06/2012 Elsevier Interactive Patient Education 2016 [employer] Inc.  

## 2015-01-23 NOTE — ED Notes (Signed)
Patient concerned about poss STD exposure. States his partner as one (her report from ED 2 days ago is negative) and he reports he is having "hot sensation " w urination

## 2015-01-24 LAB — URINE CYTOLOGY ANCILLARY ONLY
CHLAMYDIA, DNA PROBE: NEGATIVE
NEISSERIA GONORRHEA: NEGATIVE

## 2015-01-25 NOTE — ED Notes (Signed)
Final report of STD testing negative 

## 2015-01-26 NOTE — ED Notes (Signed)
Final report of STD testing negative, called patient , and after verifying ID discussed results

## 2015-07-18 ENCOUNTER — Emergency Department (HOSPITAL_COMMUNITY)
Admission: EM | Admit: 2015-07-18 | Discharge: 2015-07-18 | Disposition: A | Discharge status: Home / Self Care | Payer: Medicaid Other | Attending: Emergency Medicine | Admitting: Emergency Medicine

## 2015-07-18 DIAGNOSIS — F17210 Nicotine dependence, cigarettes, uncomplicated: Secondary | ICD-10-CM | POA: Insufficient documentation

## 2015-07-18 DIAGNOSIS — T401X1A Poisoning by heroin, accidental (unintentional), initial encounter: Secondary | ICD-10-CM | POA: Insufficient documentation

## 2015-07-18 DIAGNOSIS — Z79891 Long term (current) use of opiate analgesic: Secondary | ICD-10-CM | POA: Diagnosis not present

## 2015-07-18 NOTE — ED Provider Notes (Signed)
CSN: [id number]     Arrival date & time 07/18/15  0250 History  By signing my name below, I, [facility], attest that this documentation has been prepared under the direction and in the presence of Shon Baton, MD. Electronically Signed: Randell Patient, ED Scribe. 07/18/2015. 3:53 AM.   Chief Complaint  Patient presents with  . Drug Overdose    The history is provided by the patient. No language interpreter was used.   HPI Comments: Ryan Guerra is a 25 y.o. male brought in by EMS who presents to the Emergency Department after an accidental drug overdose that occurred earlier tonight. Pt states that he snorted heroin and took Xanax earlier tonight and went to sleep after which he woke from sleep surrounded by people. Per nursing note, pt was found unresponsive by his girlfriend after which she called EMS who administered 2 mg IM Narcan and assisted with respirations until he regained consciousness. 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 Denies SI, self-injury, or any other symptoms currently.  Past Medical History  Diagnosis Date  . Shoulder dislocation     Previous dislocstions have been on the right side.    No past surgical history on file. No family history on file. Social History  Substance Use Topics  . Smoking status: Current Every Day Smoker -- 0.50 packs/day    Types: Cigarettes  . Smokeless tobacco: Not on file  . Alcohol Use: Yes    Review of Systems  Respiratory: Negative for chest tightness and shortness of breath.   Psychiatric/Behavioral: Negative for suicidal ideas and self-injury.  All other systems reviewed and are negative.   Allergies  Review of patient's allergies indicates no known allergies.  Home Medications   Prior to Admission medications   Medication Sig Start Date End Date Taking? Authorizing Provider  oxyCODONE-acetaminophen (PERCOCET/ROXICET) 5-325 MG per tablet Take 1 tablet by mouth every 4 (four) hours as  needed. Patient not taking: Reported on 11/18/2014 07/05/14   Antony Madura, PA-C   BP 114/77 mmHg  Pulse 86  Temp(Src) 98 F (36.7 C) (Oral)  Resp 21  Ht  (2.007 m)  Wt 200 lb (90.719 kg)  BMI 22.52 kg/m2  SpO2 99% Physical Exam  Constitutional: He is oriented to person, place, and time. He appears well-developed and well-nourished.  HENT:  Head: Normocephalic and atraumatic.  Eyes: Pupils are equal, round, and reactive to light.  Pupils 4 mm reactive bilaterally  Cardiovascular: Normal rate, regular rhythm and normal heart sounds.   No murmur heard. Pulmonary/Chest: Effort normal and breath sounds normal. No respiratory distress. He has no wheezes.  Abdominal: Soft. Bowel sounds are normal. There is no tenderness.  Musculoskeletal: He exhibits no edema.  Neurological: He is alert and oriented to person, place, and time.  Skin: Skin is warm and dry.  Psychiatric: He has a normal mood and affect.  Nursing note and vitals reviewed.   ED Course  Procedures (including critical care time)  DIAGNOSTIC STUDIES: Oxygen Saturation is 100% on RA, normal by my interpretation.    COORDINATION OF CARE: 3:12 AM Discussed treatment plan with pt at bedside and pt agreed to plan.   Labs Review Labs Reviewed - No data to display  Imaging Review No results found. I have personally reviewed and evaluated these images and lab results as part of my medical decision-making.   EKG Interpretation   Date/Time:  Wednesday Jul 18 2015 02:52:22 EDT Ventricular Rate:  86 PR Interval:  143 QRS Duration: 113 QT Interval:  385 QTC Calculation: 460 R Axis:   -6 Text Interpretation:  Sinus rhythm Confirmed by HORTON  MD, Toni AmendOURTNEY  ([id number]) on 07/18/2015 3:34:50 AM      MDM   Final diagnoses:  Accidental heroin overdose, initial encounter    Patient presents following an overdose of heroin. Currently awake and oriented. Reports snorting heroin. Became responsive after Narcan. No other  physical complaints. He is tearful but otherwise reassuring. EKG is reassuring. Patient ambulated and was able to tolerate fluids. He was observed in the emergency room for approximately 2-1/2 hours without any recurrence of somnolence or respiratory depression. Discussed with the patient the risks of illicit drug use. He is not suicidal. Will provide with outpatient resources.  After history, exam, and medical workup I feel the patient has been appropriately medically screened and is safe for discharge home. Pertinent diagnoses were discussed with the patient. Patient was given return precautions.  I personally performed the services described in this documentation, which was scribed in my presence. The recorded information has been reviewed and is accurate.   Shon Batonourtney F Horton, MD 07/18/15 [phone]

## 2015-07-18 NOTE — ED Notes (Signed)
Bed: [id number] Expected date:  Expected time:  Means of arrival:  Comments: EMS 25 yo male/heroin overdose/narcan

## 2015-07-18 NOTE — Discharge Instructions (Signed)
Narcotic Overdose °A narcotic overdose is the misuse or overuse of a narcotic drug. A narcotic overdose can make you pass out and stop breathing. If you are not treated right away, this can cause permanent brain damage or stop your heart. Medicine may be given to reverse the effects of an overdose. If so, this medicine may bring on withdrawal symptoms. The symptoms may be abdominal cramps, throwing up (vomiting), sweating, chills, and nervousness. °Injecting narcotics can cause more problems than just an overdose. AIDS, hepatitis, and other very serious infections are transmitted by sharing needles and syringes. If you decide to quit using, there are medicines which can help you through the withdrawal period. Trying to quit all at once on your own can be uncomfortable, but not life-threatening. Call your caregiver, Narcotics Anonymous, or any drug and alcohol treatment program for further help.  °  °This information is not intended to replace advice given to you by your health care provider. Make sure you discuss any questions you have with your health care provider. °  °Document Released: 03/20/2004 Document Revised: 03/03/2014 Document Reviewed: 08/02/2014 °Elsevier Interactive Patient Education ©2016 Elsevier Inc. °Community Resource Guide Outpatient Counseling/Substance Abuse Adult °The United Way’s “211” is a great source of information about community services available.  Access by dialing 2-1-1 from anywhere in [place], or by website -  www.nc211.org.  ° °Other Local Resources (Updated 02/2015) ° °Crisis Hotlines °  °Services  ° °  °Area Served  °[facility] • Crisis Hotline, available 24 hours a day, 7 days a week: 800-939-5911 [place], [place]  ° [facility] • Crisis Hotline, available 24 hours a day, 7 days a week: 866-275-9552 [place], [place]  °[facility] • Suicide Prevention Hotline, available 24 hours a day, 7 days a week: 800-273-8255 [place], [place]  °[facility] ° • Crisis Hotline, available 24 hours a day, 7 days a week: 336-676-6840 [place], [place] °  °[facility] • Crisis Hotline, available 24 hours a day, 7 days a week: 800-256-2452 All °  °[facility] • Crisis Hotline, available 24 hours a day, 7 days a week: 877-626-1772 All  ° °Other Local Resources (Updated 02/2015) ° °Outpatient Counseling/ Substance Abuse Programs  °Services  ° °  °Address and Phone Number  °[facility] ° • Options include Individual counseling, group counseling, intensive outpatient program (several hours a day, several days a week) °• Offers depression assessments °• Provides methadone maintenance program 336-333-6860 °[address] °[place], [place] [address] °  °[facility] ° • Offers partial hospitalization/day treatment and DUI/DWI programs °• Accepts Medicare, private insurance 336-299-4655 °[address] °[place], [place] [address]  °[facility] ° ° • Services include intensive outpatient program (several hours a day, several days a week), outpatient treatment, DUI/DWI services, family education °• Also has some services specifically for Veterans °• Offers transitional housing  336-886-5594 °[address] °[place], [place] [address] °  °  ° [facility] • Accepts Medicare, private pay, and private insurance 336-272-0855 °[address] °[place], [place] [address]  °[facility] • Services include individual counseling, substance abuse intensive outpatient program (several hours a day, several days a week), day treatment °• Accepts Medicare, Medicaid, private insurance 336-271-5888 °[address] °[place], [place] [address]  °[facility] ° • Offers substance abuse intensive outpatient program (several hours a day, several   days a week), partial hospitalization program  336-832-9800 °[address] °[place], [place] [address] ° °336-349-4454 °[address] °[place], [place] [address] ° °336-386-3795 °[address] °[place], [place] [address] ° °336-993-6120 °[address] °[place], [place] [address]  °[facility] • Individual counseling only °• Accepts private insurance only 336-292-1510 °[address] °[place], [place] [address]  °[facility] • Methadone maintenance program 800-805-6989 °[address] °[place], [place] [address]  °[facility] • Walk-In Clinic providing substance abuse and mental health counseling °• Accepts Medicaid, Medicare, private insurance °• Offers sliding scale for uninsured 336-342-8316 °[address] °[place], [place]   °[facility] • Offers individual counseling, and intensive in-home services 336-347-7415 °[address] °[place], [place] [address]  °[facility] • Offers individual counseling, family counseling, group therapy, domestic violence counseling, consumer credit counseling °• Accepts Medicare, Medicaid, private insurance °• Offers sliding scale for uninsured 336-387-6161 °[address] °[place], [place] [address] ° °336-889-6161 °[place], [address] °[place], [place] [address]  °[facility] • Offers individual, family and group counseling °• 3 locations - [place], [place], and [place] ° 336-899-8800 ° °[address] °[place], [place] [address] ° °[address] °[place], [place] [address] ° °[address] °[place], [place] [address]  °[facility]  ° • Offers psychiatric assessment, 8-week Intensive Outpatient Program (several hours a day, several times a week, daytime or evenings), early recovery group, family Program, medication management °• Private pay or private insurance only 336 -621-3381, or  °800-659-3381 °[address] °[place], [place] [address]  °[facility] • Offers individual, couples and family  counseling °• Accepts Medicaid, private insurance, and sliding scale for uninsured 336-542-2076 °[address] °[place], [place] [address]  °David Fuller, MD • Individual counseling °• Private insurance 336-852-4051 °[address] °[place], [place] [address]  °[facility] ° • Offers assessment, substance abuse treatment, and behavioral health treatment 336-878-6098 °[address] °[place], [place] [address]  °[facility] • Individual counseling °• Accepts private insurance 336-272-1972 °[address] °[place], [place] [address]  °[facility] • Individual counseling °• Accepts Medicare, private insurance 336-547-1574 °[address] °[place], [place] [address]  °[facility]  ° • Offers intensive outpatient program (several hours a day, several times a week) °• Private pay, private insurance [phone] °[address] °[place], [place]  °[facility] • Individual counseling °• Medicare, private insurance 336-505-9494 °[address] °[place], [place] [address]  °[facility]  ° • Offers intensive outpatient program (several hours a day, several times a week) and partial hospitalization program 336-794-3550 °[address] °[place], [place] [address]  °Parrish McKinney, MD • Individual counseling 336-282-1251 °[address] °[place], [place] [address]  °[facility] • Offers [place] counseling to individuals, couples, and families °• Accepts Medicare and private insurance; offers sliding scale for uninsured 336-288-1484 °[address] °[place],  [address]  °[facility] • Stephanie counseling 336-542-2060 °[address] °,  [address]  °[facility] ° • Offers crisis counseling, individual counseling, group therapy, in-home therapy, domestic violence services, day treatment, DWI services,  Community Support Team (CST), Assertive Community Treatment Team (ACTT), substance abuse Intensive Outpatient Program (several hours a day, several times a week) °• 2 locations - [place] and [place] 336-229-5905 °[address] °[place],    [address] ° °336-694-1777 °[address] °[place], [place] [address]  °[facility]  ° ° • Individual counseling and group therapy °• Accepts private insurance, Medicare, Medicaid 336-379-7146 °[address] °[place], [place]  °[facility] • Offers individual and family counseling °• Offers LGBTQ services °• Accepts private insurance and private pay 336-288-9190 °[address] °[place], [place] [address]  °[facility]  ° • Offers individual counseling, group therapy, and outpatient detox °• Accepts private insurance 336-389-1413 °[address] °[place], [place]  °[facility] • Individual counseling °• Accepts Medicare, private insurance 336-632-3505 °[address] °[place], [place] [address]  °[facility] • Individual counseling °• Accepts Medicare, private insurance 336-570-0104 °[address] °[place], [place] [address]  °[facility] ° • Offers substance abuse Intensive Outpatient Program (several hours a day, several times a week) 336-323-1385, or °[phone] °[place], [place]  ° °

## 2015-07-18 NOTE — ED Notes (Addendum)
Pt BIB EMS from home. Found laying unresposive by girlfriend. Given 2mg  narcan IM. Upon EMS arrived. Shallow respirations 80%. EMS assisted with respirations for 2 mins and pt became A&Ox4. Admits to snorting heroin 2 hours ago. Hx of heroin overdose approx 2 months ago.

## 2015-09-04 ENCOUNTER — Other Ambulatory Visit: Payer: Self-pay | Admitting: Orthopedic Surgery

## 2015-09-05 ENCOUNTER — Other Ambulatory Visit: Payer: Self-pay | Admitting: Orthopedic Surgery

## 2015-09-08 ENCOUNTER — Other Ambulatory Visit: Payer: Self-pay | Admitting: Orthopedic Surgery

## 2015-09-08 DIAGNOSIS — M25511 Pain in right shoulder: Secondary | ICD-10-CM

## 2015-09-10 ENCOUNTER — Other Ambulatory Visit: Payer: Self-pay | Admitting: Orthopedic Surgery

## 2015-09-10 DIAGNOSIS — M25511 Pain in right shoulder: Secondary | ICD-10-CM

## 2015-09-20 ENCOUNTER — Ambulatory Visit
Admission: RE | Admit: 2015-09-20 | Discharge: 2015-09-20 | Disposition: A | Discharge status: Home / Self Care | Payer: Medicaid Other | Source: Ambulatory Visit | Attending: Orthopedic Surgery | Admitting: Orthopedic Surgery

## 2015-09-20 DIAGNOSIS — M25511 Pain in right shoulder: Secondary | ICD-10-CM

## 2015-11-25 DIAGNOSIS — M25311 Other instability, right shoulder: Secondary | ICD-10-CM

## 2015-11-25 HISTORY — DX: Other instability, right shoulder: M25.311

## 2015-12-25 ENCOUNTER — Encounter (HOSPITAL_BASED_OUTPATIENT_CLINIC_OR_DEPARTMENT_OTHER): Payer: Self-pay | Admitting: *Deleted

## 2015-12-25 ENCOUNTER — Other Ambulatory Visit: Payer: Self-pay | Admitting: Orthopedic Surgery

## 2015-12-25 DIAGNOSIS — R0989 Other specified symptoms and signs involving the circulatory and respiratory systems: Secondary | ICD-10-CM

## 2015-12-25 DIAGNOSIS — J029 Acute pharyngitis, unspecified: Secondary | ICD-10-CM

## 2015-12-25 HISTORY — DX: Other specified symptoms and signs involving the circulatory and respiratory systems: R09.89

## 2015-12-25 HISTORY — DX: Acute pharyngitis, unspecified: J02.9

## 2015-12-31 ENCOUNTER — Ambulatory Visit (HOSPITAL_BASED_OUTPATIENT_CLINIC_OR_DEPARTMENT_OTHER): Payer: Medicaid Other | Admitting: Anesthesiology

## 2015-12-31 ENCOUNTER — Ambulatory Visit (HOSPITAL_BASED_OUTPATIENT_CLINIC_OR_DEPARTMENT_OTHER)
Admission: RE | Admit: 2015-12-31 | Discharge: 2015-12-31 | Disposition: A | Discharge status: Home / Self Care | Payer: Medicaid Other | Source: Ambulatory Visit | Attending: Orthopedic Surgery | Admitting: Orthopedic Surgery

## 2015-12-31 ENCOUNTER — Encounter (HOSPITAL_BASED_OUTPATIENT_CLINIC_OR_DEPARTMENT_OTHER): Payer: Self-pay

## 2015-12-31 ENCOUNTER — Encounter (HOSPITAL_BASED_OUTPATIENT_CLINIC_OR_DEPARTMENT_OTHER)
Admission: RE | Disposition: A | Discharge status: Home / Self Care | Payer: Self-pay | Source: Ambulatory Visit | Attending: Orthopedic Surgery

## 2015-12-31 ENCOUNTER — Ambulatory Visit (HOSPITAL_COMMUNITY): Payer: Medicaid Other

## 2015-12-31 DIAGNOSIS — M25311 Other instability, right shoulder: Secondary | ICD-10-CM | POA: Diagnosis not present

## 2015-12-31 DIAGNOSIS — M898X1 Other specified disorders of bone, shoulder: Secondary | ICD-10-CM

## 2015-12-31 DIAGNOSIS — F17210 Nicotine dependence, cigarettes, uncomplicated: Secondary | ICD-10-CM | POA: Insufficient documentation

## 2015-12-31 DIAGNOSIS — K219 Gastro-esophageal reflux disease without esophagitis: Secondary | ICD-10-CM | POA: Diagnosis not present

## 2015-12-31 HISTORY — DX: Other specified symptoms and signs involving the circulatory and respiratory systems: R09.89

## 2015-12-31 HISTORY — PX: SHOULDER LATERJET: SHX6528

## 2015-12-31 HISTORY — DX: Other instability, right shoulder: M25.311

## 2015-12-31 HISTORY — DX: Gastro-esophageal reflux disease without esophagitis: K21.9

## 2015-12-31 HISTORY — DX: Acute pharyngitis, unspecified: J02.9

## 2015-12-31 SURGERY — REPAIR, SHOULDER, LATARJET
Anesthesia: General | Site: Shoulder | Laterality: Right

## 2015-12-31 MED ORDER — SUCCINYLCHOLINE CHLORIDE 20 MG/ML IJ SOLN
INTRAMUSCULAR | Status: DC | PRN
  Administered 2015-12-31: 120 mg via INTRAVENOUS

## 2015-12-31 MED ORDER — MIDAZOLAM HCL 2 MG/2ML IJ SOLN
INTRAMUSCULAR | Status: AC
  Filled 2015-12-31: qty 2

## 2015-12-31 MED ORDER — MIDAZOLAM HCL 2 MG/2ML IJ SOLN
1.0000 mg | INTRAMUSCULAR | Status: DC | PRN
  Administered 2015-12-31 (×2): 2 mg via INTRAVENOUS

## 2015-12-31 MED ORDER — FENTANYL CITRATE (PF) 100 MCG/2ML IJ SOLN
INTRAMUSCULAR | Status: AC
  Filled 2015-12-31: qty 2

## 2015-12-31 MED ORDER — ONDANSETRON HCL 4 MG/2ML IJ SOLN
INTRAMUSCULAR | Status: AC
  Filled 2015-12-31: qty 2

## 2015-12-31 MED ORDER — DEXAMETHASONE SODIUM PHOSPHATE 4 MG/ML IJ SOLN
INTRAMUSCULAR | Status: DC | PRN
  Administered 2015-12-31: 10 mg via INTRAVENOUS

## 2015-12-31 MED ORDER — POVIDONE-IODINE 7.5 % EX SOLN: Freq: Once | CUTANEOUS | Status: DC

## 2015-12-31 MED ORDER — PROPOFOL 10 MG/ML IV BOLUS
INTRAVENOUS | Status: DC | PRN
  Administered 2015-12-31: 200 mg via INTRAVENOUS

## 2015-12-31 MED ORDER — CEFAZOLIN SODIUM-DEXTROSE 2-4 GM/100ML-% IV SOLN
INTRAVENOUS | Status: AC
  Filled 2015-12-31: qty 100

## 2015-12-31 MED ORDER — ONDANSETRON HCL 4 MG/2ML IJ SOLN
INTRAMUSCULAR | Status: DC | PRN
  Administered 2015-12-31: 4 mg via INTRAVENOUS

## 2015-12-31 MED ORDER — SCOPOLAMINE 1 MG/3DAYS TD PT72: 1.0000 | MEDICATED_PATCH | Freq: Once | TRANSDERMAL | Status: DC | PRN

## 2015-12-31 MED ORDER — LIDOCAINE 2% (20 MG/ML) 5 ML SYRINGE
INTRAMUSCULAR | Status: AC
  Filled 2015-12-31: qty 10

## 2015-12-31 MED ORDER — BUPIVACAINE-EPINEPHRINE (PF) 0.5% -1:200000 IJ SOLN
INTRAMUSCULAR | Status: DC | PRN
  Administered 2015-12-31: 15 mL via PERINEURAL

## 2015-12-31 MED ORDER — DEXAMETHASONE SODIUM PHOSPHATE 10 MG/ML IJ SOLN
INTRAMUSCULAR | Status: AC
  Filled 2015-12-31: qty 1

## 2015-12-31 MED ORDER — LIDOCAINE HCL 4 % EX SOLN
CUTANEOUS | Status: DC | PRN
  Administered 2015-12-31: 3 mL via TOPICAL

## 2015-12-31 MED ORDER — LIDOCAINE HCL (CARDIAC) 20 MG/ML IV SOLN
INTRAVENOUS | Status: DC | PRN
  Administered 2015-12-31: 60 mg via INTRAVENOUS

## 2015-12-31 MED ORDER — CEFAZOLIN SODIUM-DEXTROSE 2-4 GM/100ML-% IV SOLN
2.0000 g | INTRAVENOUS | Status: AC
  Administered 2015-12-31: 2 g via INTRAVENOUS

## 2015-12-31 MED ORDER — FENTANYL CITRATE (PF) 100 MCG/2ML IJ SOLN
50.0000 ug | INTRAMUSCULAR | Status: DC | PRN
  Administered 2015-12-31: 100 ug via INTRAVENOUS
  Administered 2015-12-31: 50 ug via INTRAVENOUS

## 2015-12-31 MED ORDER — LACTATED RINGERS IV SOLN
INTRAVENOUS | Status: DC
  Administered 2015-12-31 (×2): via INTRAVENOUS

## 2015-12-31 MED ORDER — SODIUM CHLORIDE 0.9 % IR SOLN
Status: DC | PRN
  Administered 2015-12-31: 1

## 2015-12-31 SURGICAL SUPPLY — 79 items
BIT DRILL Q/COUPLING 1 (BIT) ×3 IMPLANT
BLADE AVERAGE 25MMX9MM (BLADE) ×1
BLADE AVERAGE 25X9 (BLADE) ×2 IMPLANT
BLADE SAW SAG 19X10X0.6 (BLADE) IMPLANT
BLADE SAW SAG 19X10X0.6MM (BLADE)
BLADE SURG 10 STRL SS (BLADE) ×3 IMPLANT
BLADE SURG 15 STRL LF DISP TIS (BLADE) ×2 IMPLANT
BLADE SURG 15 STRL SS (BLADE) ×4
BUR EGG 3PK/BX (BURR) IMPLANT
CHLORAPREP W/TINT 26ML (MISCELLANEOUS) ×3 IMPLANT
CLOSURE WOUND 1/2 X4 (GAUZE/BANDAGES/DRESSINGS) ×1
DECANTER SPIKE VIAL GLASS SM (MISCELLANEOUS) IMPLANT
DRAPE C-ARM 42X72 X-RAY (DRAPES) ×3 IMPLANT
DRAPE INCISE IOBAN 66X45 STRL (DRAPES) ×3 IMPLANT
DRAPE SURG 17X23 STRL (DRAPES) ×3 IMPLANT
DRAPE U-SHAPE 47X51 STRL (DRAPES) ×6 IMPLANT
DRAPE U-SHAPE 76X120 STRL (DRAPES) ×6 IMPLANT
DRSG TEGADERM 4X4.75 (GAUZE/BANDAGES/DRESSINGS) ×6 IMPLANT
ELECT BLADE 6.5 .24CM SHAFT (ELECTRODE) IMPLANT
ELECT NEEDLE TIP 2.8 STRL (NEEDLE) ×3 IMPLANT
ELECT REM PT RETURN 9FT ADLT (ELECTROSURGICAL) ×3
ELECTRODE REM PT RTRN 9FT ADLT (ELECTROSURGICAL) ×1 IMPLANT
GAUZE SPONGE 4X4 12PLY STRL (GAUZE/BANDAGES/DRESSINGS) ×3 IMPLANT
GAUZE SPONGE 4X4 16PLY XRAY LF (GAUZE/BANDAGES/DRESSINGS) IMPLANT
GLOVE BIO SURGEON STRL SZ7 (GLOVE) ×6 IMPLANT
GLOVE BIO SURGEON STRL SZ7.5 (GLOVE) ×6 IMPLANT
GLOVE BIOGEL PI IND STRL 7.0 (GLOVE) ×1 IMPLANT
GLOVE BIOGEL PI IND STRL 8 (GLOVE) ×3 IMPLANT
GLOVE BIOGEL PI INDICATOR 7.0 (GLOVE) ×2
GLOVE BIOGEL PI INDICATOR 8 (GLOVE) ×6
GLOVE SURG SYN 7.5  E (GLOVE) ×2
GLOVE SURG SYN 7.5 E (GLOVE) ×1 IMPLANT
GOWN STRL REUS W/ TWL LRG LVL3 (GOWN DISPOSABLE) ×1 IMPLANT
GOWN STRL REUS W/TWL 2XL LVL3 (GOWN DISPOSABLE) ×3 IMPLANT
GOWN STRL REUS W/TWL LRG LVL3 (GOWN DISPOSABLE) ×2
KIT BIO-TENODESIS 3X8 DISP (MISCELLANEOUS)
KIT INSRT BABSR STRL DISP BTN (MISCELLANEOUS) IMPLANT
NS IRRIG 1000ML POUR BTL (IV SOLUTION) ×3 IMPLANT
PACK ARTHROSCOPY DSU (CUSTOM PROCEDURE TRAY) ×3 IMPLANT
PACK BASIN DAY SURGERY FS (CUSTOM PROCEDURE TRAY) ×3 IMPLANT
PASSER SUT SWANSON 36MM LOOP (INSTRUMENTS) IMPLANT
PENCIL BUTTON HOLSTER BLD 10FT (ELECTRODE) ×3 IMPLANT
PIN STEINMAN 7/64X9 TRO PT NS (PIN) ×3 IMPLANT
RETRIEVER SUT HEWSON (MISCELLANEOUS) IMPLANT
SCREW MALLEO CANCELLOUS 35MM (Screw) ×3 IMPLANT
SCREW MALLEO CANCELLOUS 40MM (Screw) ×3 IMPLANT
SHEET MEDIUM DRAPE 40X70 STRL (DRAPES) IMPLANT
SLEEVE SCD COMPRESS KNEE MED (MISCELLANEOUS) ×3 IMPLANT
SLING ARM FOAM STRAP LRG (SOFTGOODS) IMPLANT
SLING ARM IMMOBILIZER LRG (SOFTGOODS) ×3 IMPLANT
SLING ARM IMMOBILIZER MED (SOFTGOODS) IMPLANT
SLING ARM MED ADULT FOAM STRAP (SOFTGOODS) IMPLANT
SLING ARM XL FOAM STRAP (SOFTGOODS) IMPLANT
SPONGE LAP 18X18 X RAY DECT (DISPOSABLE) ×3 IMPLANT
SPONGE LAP 4X18 X RAY DECT (DISPOSABLE) ×3 IMPLANT
STRIP CLOSURE SKIN 1/2X4 (GAUZE/BANDAGES/DRESSINGS) ×2 IMPLANT
SUCTION FRAZIER HANDLE 10FR (MISCELLANEOUS)
SUCTION TUBE FRAZIER 10FR DISP (MISCELLANEOUS) IMPLANT
SUT 2 FIBERLOOP 20 STRT BLUE (SUTURE)
SUT ETHIBOND 2 OS 4 DA (SUTURE) IMPLANT
SUT ETHILON 4 0 PS 2 18 (SUTURE) IMPLANT
SUT FIBERWIRE #2 38 T-5 BLUE (SUTURE)
SUT MNCRL AB 3-0 PS2 18 (SUTURE) IMPLANT
SUT MNCRL AB 4-0 PS2 18 (SUTURE) ×3 IMPLANT
SUT PDS 2 CP NEEDLE XSPECIAL (SUTURE) ×3 IMPLANT
SUT PDS AB 0 CT 36 (SUTURE) IMPLANT
SUT PROLENE 3 0 PS 2 (SUTURE) IMPLANT
SUT TIGER TAPE 7 IN WHITE (SUTURE) IMPLANT
SUT VIC AB 0 CT1 27 (SUTURE) ×2
SUT VIC AB 0 CT1 27XBRD ANBCTR (SUTURE) ×1 IMPLANT
SUT VIC AB 2-0 SH 27 (SUTURE) ×2
SUT VIC AB 2-0 SH 27XBRD (SUTURE) ×1 IMPLANT
SUTURE 2 FIBERLOOP 20 STRT BLU (SUTURE) IMPLANT
SUTURE FIBERWR #2 38 T-5 BLUE (SUTURE) IMPLANT
SYR BULB 3OZ (MISCELLANEOUS) ×3 IMPLANT
TAPE FIBER 2MM 7IN #2 BLUE (SUTURE) IMPLANT
TOWEL OR 17X24 6PK STRL BLUE (TOWEL DISPOSABLE) ×3 IMPLANT
TOWEL OR NON WOVEN STRL DISP B (DISPOSABLE) ×3 IMPLANT
YANKAUER SUCT BULB TIP NO VENT (SUCTIONS) ×3 IMPLANT

## 2015-12-31 NOTE — Progress Notes (Signed)
AssistedDr. Hollis with right, ultrasound guided, interscalene  block. Side rails up, monitors on throughout procedure. See vital signs in flow sheet. Tolerated Procedure well.  

## 2015-12-31 NOTE — Anesthesia Preprocedure Evaluation (Addendum)
Anesthesia Evaluation  Patient identified by MRN, date of birth, ID band Patient awake    Reviewed: Allergy & Precautions, NPO status , Patient's Chart, lab work & pertinent test results  Airway Mallampati: I  TM Distance: >3 FB Neck ROM: Full    Dental  (+) Teeth Intact, Dental Advisory Given   Pulmonary Current Smoker,    breath sounds clear to auscultation       Cardiovascular negative cardio ROS   Rhythm:Regular Rate:Normal     Neuro/Psych negative neurological ROS  negative psych ROS   GI/Hepatic Neg liver ROS, GERD  ,  Endo/Other  negative endocrine ROS  Renal/GU negative Renal ROS  negative genitourinary   Musculoskeletal negative musculoskeletal ROS (+)   Abdominal   Peds negative pediatric ROS (+)  Hematology negative hematology ROS (+)   Anesthesia Other Findings   Reproductive/Obstetrics negative OB ROS                            Lab Results  Component Value Date   WBC 20.2 (H) 11/18/2014   HGB 15.2 11/18/2014   HCT 43.4 11/18/2014   MCV 86.5 11/18/2014   PLT 187 11/18/2014   Lab Results  Component Value Date   CREATININE 1.09 11/18/2014   BUN 16 11/18/2014   NA 136 11/18/2014   K 3.7 11/18/2014   CL 102 11/18/2014   CO2 23 11/18/2014   No results found for: INR, PROTIME   06/2015 EKG: normal sinus rhythm.  Anesthesia Physical Anesthesia Plan  ASA: II  Anesthesia Plan: General   Post-op Pain Management: GA combined w/ Regional for post-op pain   Induction: Intravenous  Airway Management Planned: Oral ETT  Additional Equipment:   Intra-op Plan:   Post-operative Plan: Extubation in OR  Informed Consent: I have reviewed the patients History and Physical, chart, labs and discussed the procedure including the risks, benefits and alternatives for the proposed anesthesia with the patient or authorized representative who has indicated his/her understanding  and acceptance.   Dental advisory given  Plan Discussed with: CRNA  Anesthesia Plan Comments:         Anesthesia Quick Evaluation

## 2015-12-31 NOTE — Anesthesia Procedure Notes (Signed)
Procedure Name: Intubation Date/Time: 12/31/2015 12:03 PM Performed by: Burna CashONRAD, Brynja Marker C Pre-anesthesia Checklist: Patient identified, Emergency Drugs available, Suction available and Patient being monitored Patient Re-evaluated:Patient Re-evaluated prior to inductionOxygen Delivery Method: Circle system utilized Preoxygenation: Pre-oxygenation with 100% oxygen Intubation Type: IV induction Ventilation: Mask ventilation without difficulty Laryngoscope Size: Mac and 4 Tube type: Oral Tube size: 8.0 mm Number of attempts: 1 Airway Equipment and Method: Stylet and Oral airway Placement Confirmation: ETT inserted through vocal cords under direct vision,  positive ETCO2 and breath sounds checked- equal and bilateral Secured at: 22 cm Tube secured with: Tape Dental Injury: Teeth and Oropharynx as per pre-operative assessment

## 2015-12-31 NOTE — Anesthesia Procedure Notes (Addendum)
Anesthesia Regional Block:  Interscalene brachial plexus block  Pre-Anesthetic Checklist: ,, timeout performed, Correct Patient, Correct Site, Correct Laterality, Correct Procedure, Correct Position, site marked, Risks and benefits discussed,  Surgical consent,  Pre-op evaluation,  At surgeon's request and post-op pain management  Laterality: Right  Prep: chloraprep       Needles:  Injection technique: Single-shot  Needle Type: Echogenic Needle     Needle Length: 9cm 9 cm Needle Gauge: 21 and 21 G    Additional Needles:  Procedures: ultrasound guided (picture in chart) Interscalene brachial plexus block Narrative:  Start time: 12/31/2015 10:35 AM End time: 12/31/2015 10:38 AM Injection made incrementally with aspirations every 5 mL.  Performed by: Personally  Anesthesiologist: Shona SimpsonHOLLIS, Arlena Marsan D  Additional Notes: No immediate complications noted. Pt tolerated well.

## 2015-12-31 NOTE — Discharge Instructions (Signed)
Discharge Instructions after Shoulder Repair   A sling has been provided for you. Remain in your sling at all times. This includes sleeping in your sling.  Use ice on the shoulder intermittently over the first 48 hours after surgery.  Pain medicine has been prescribed for you.  Use your medicine liberally over the first 48 hours, and then you can begin to taper your use. You may take Extra Strength Tylenol or Tylenol only in place of the pain pills. DO NOT take ANY nonsteroidal anti-inflammatory pain medications: Advil, Motrin, Ibuprofen, Aleve, Naproxen, or Narprosyn.  You may remove your dressing after two days. Please leave the tape Steri-strips in place until your visit with Dr. Ave Filterhandler. You may shower 5 days after surgery. The incisions CANNOT get wet prior to 5 days. Simply allow the water to wash over the site and then pat dry. Do not rub the incisions. Make sure your axilla (armpit) is completely dry after showering.  Take one aspirin a day for 2 weeks after surgery, unless you have an aspirin sensitivity/ allergy or asthma.   Please call [phone] during normal business hours or [phone] after hours for any problems. Including the following:  - excessive redness of the incisions - drainage for more than 4 days - fever of more than 101.5 F      Regional Anesthesia Blocks  1. Numbness or the inability to move the "blocked" extremity may last from 3-48 hours after placement. The length of time depends on the medication injected and your individual response to the medication. If the numbness is not going away after 48 hours, call your surgeon.  2. The extremity that is blocked will need to be protected until the numbness is gone and the  Strength has returned. Because you cannot feel it, you will need to take extra care to avoid injury. Because it may be weak, you may have difficulty moving it or using it. You may not know what position it is in without looking at it while the  block is in effect.  3. For blocks in the legs and feet, returning to weight bearing and walking needs to be done carefully. You will need to wait until the numbness is entirely gone and the strength has returned. You should be able to move your leg and foot normally before you try and bear weight or walk. You will need someone to be with you when you first try to ensure you do not fall and possibly risk injury.  4. Bruising and tenderness at the needle site are common side effects and will resolve in a few days.  5. Persistent numbness or new problems with movement should be communicated to the surgeon or the [facility] [phone])/ [facility] [phone]).       Post Anesthesia Home Care Instructions  Activity: Get plenty of rest for the remainder of the day. A responsible adult should stay with you for 24 hours following the procedure.  For the next 24 hours, DO NOT: -Drive a car -Advertising copywriterperate machinery -Drink alcoholic beverages -Take any medication unless instructed by your physician -Make any legal decisions or sign important papers.  Meals: Start with liquid foods such as gelatin or soup. Progress to regular foods as tolerated. Avoid greasy, spicy, heavy foods. If nausea and/or vomiting occur, drink only clear liquids until the nausea and/or vomiting subsides. Call your physician if vomiting continues.  Special Instructions/Symptoms: Your throat may feel dry or sore from the anesthesia or  the breathing tube placed in your throat during surgery. If this causes discomfort, gargle with warm salt water. The discomfort should disappear within 24 hours.  If you had a scopolamine patch placed behind your ear for the management of post- operative nausea and/or vomiting:  1. The medication in the patch is effective for 72 hours, after which it should be removed.  Wrap patch in a tissue and discard in the trash. Wash hands thoroughly with soap and water. 2.  You may remove the patch earlier than 72 hours if you experience unpleasant side effects which may include dry mouth, dizziness or visual disturbances. 3. Avoid touching the patch. Wash your hands with soap and water after contact with the patch.     *Please note that pain medications will not be refilled after hours or on weekends.

## 2015-12-31 NOTE — Op Note (Signed)
Procedure(s): OPEN CORACOID TRANSFER Procedure Note  Ryan Guerra male 25 y.o. 12/31/2015  Procedure(s) and Anesthesia Type:    * OPEN CORACOID TRANSFER - General  Surgeon(s) and Role:    * Jones BroomJustin Avalie Oconnor, MD - Primary   Indications:  [age] y.o. male with right recurrent shoulder instability. Significant bone loss precludes arthroscopic management. Indicated for surgical treatment to restore stability.     Surgeon: Mable ParisHANDLER,Charlea Nardo WILLIAM   Assistants: Damita Lackanielle Lalibert PA-C [facility](Danielle was present and scrubbed throughout the procedure and was essential in positioning, retraction, exposure, and closure)  Anesthesia: General endotracheal anesthesia    Procedure Detail  OPEN CORACOID TRANSFER  Findings: Coracoid transfer with excellent graft, excellent fixation. The glenohumeral joint otherwise looked good.  Estimated Blood Loss:  less than 50 mL         Drains: none  Blood Given: none         Specimens: none        Complications:  * No complications entered in OR log *         Disposition: PACU - hemodynamically stable.         Condition: stable    Procedure:  DESCRIPTION OF PROCEDURE: The patient was identified in preoperative  holding area where I personally marked the operative site after  verifying site, side, and procedure with the patient. The patient was taken back  to the operating room where general anesthesia was induced without  complication and was placed in the beach-chair position with the back  elevated about 30 degrees and all extremities carefully padded and  positioned. The right upper extremity was then prepped and  draped in a standard sterile fashion. The appropriate time-out  procedure was carried out. The patient did receive IV antibiotics  within 30 minutes of incision.  An incision was made from the palpable tip of the coracoid distally approximately 6-7 cm. Dissection was carried down through subcutaneous tissues creating skin flaps  medial and lateral. The deltopectoral interval was identified and the cephalic vein was taken laterally with the deltoid. The underlying conjoined tendon was identified and retractor was placed above the coracoid. The lateral edge of the conjoined tendon was developed and the coracoacromial ligament was split leaving some tendon still attached to the coracoid. The pectoralis minor was then taken off the medial aspect of the coracoid. The dorsal aspect was exposed to the base of the coracoclavicular ligaments. A right angled saw was then used from medial to lateral to come through the base of the coracoid and a curved osteotome was used to complete the cut. A nice piece just over 2 cm was taken. The undersurface was cleaned of soft tissues and then decorticated with a saw and bur. The 3.2 mm drill was then used to create 2 drill holes separated by about 8 mm. The coracoid piece was then placed under the pectoralis major medially. The arm was externally rotated exposing the muscular portion of the subscapularis. Curved Mayo scissors were then used to open the interval between the upper two thirds and lower one third of the subscapularis. The underlying capsule was identified. Small sponge and retractor was placed medially. A blunt retractor was placed inferiorly outside the capsule. Capsule was then sharply opened at the glenohumeral joint. A Steinmann pin was placed superiorly on the scapular neck to retract the upper subscapularis. A portion of the anterior medial capsule and labrum was then excised. There was a bony Bankart fragment which was incompletely healed. This was removed completely  with a rongeur. The flat surface of the anterior glenoid was then prepared down to bleeding bone to accept the graft. The coracoid graft was then brought back into the field and careful dissection was made medial and lateral to allow enough excursion. The musculocutaneous nerve was identified and protected. Once it was  confirmed that the graft would fit nicely in its position the 3.2 mm drill was then used at about the 5:00 position on the glenoid parallel to the articular surface about 7 mm medial. A 35 mm 4.5 malleolar screw was then used to affix the lower portion of the graft. The upper hole was then drilled and measured bicortically. A 40 mm screw was placed here with excellent fixation. The graft was noted to be in a very good position with no lateral overhang. The joint was copiously irrigated. Fluoroscopic imaging showed the screws to be in excellent position with no graft overhang. The capsule was repaired to the coracoacromial ligament stump on the coracoid. The subscapularis was allowed to close back on itself but no formal repair was necessary here. The deltopectoral interval also closed on itself nicely with no repair necessary. Copious irrigation was again used. Skin was then closed in layers with 2-0 Vicryl and 4-0 Monocryl. Steri-Strips were applied.    Sterile dressings were applied including a medium  Mepilex dressing. The patient was then allowed to awaken from general  anesthesia, placed in a sling, transferred to stretcher and taken to the  recovery room in stable condition.   POSTOPERATIVE PLAN: He will be observed in the recovery room today, but likely can be discharged home with family. Follow-up will be in 2 weeks.

## 2015-12-31 NOTE — Anesthesia Postprocedure Evaluation (Signed)
Anesthesia Post Note  Patient: Ryan Guerra  Procedure(s) Performed: Procedure(s) (LRB): OPEN CORACOID TRANSFER (Right)  Patient location during evaluation: PACU Anesthesia Type: General and Regional Level of consciousness: awake and alert Pain management: pain level controlled Vital Signs Assessment: post-procedure vital signs reviewed and stable Respiratory status: spontaneous breathing, nonlabored ventilation, respiratory function stable and patient connected to nasal cannula oxygen Cardiovascular status: blood pressure returned to baseline and stable Postop Assessment: no signs of nausea or vomiting Anesthetic complications: no    Last Vitals:  Vitals:   12/31/15 1515 12/31/15 1532  BP: 129/86 (!) 144/86  Pulse: 70 78  Resp: 17   Temp:  36.9 C    Last Pain:  Vitals:   12/31/15 1532  TempSrc:   PainSc: 0-No pain                 Shelton SilvasKevin D Janis Sol

## 2015-12-31 NOTE — H&P (Signed)
Ryan IpChristian B Guerra is an 25 y.o. male.   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  Failed conservative treatment.  Past Medical History:  Diagnosis Date  . GERD (gastroesophageal reflux disease)    no current med.  Altamese Cabal. Runny nose 12/25/2015   clear drainage, per pt.  . 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 Sore throat 12/25/2015    Past Surgical History:  Procedure Laterality Date  . 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 No pertinent family history. Social History:  reports that he has been smoking Cigarettes.  He has smoked for the past 8.00 years. He has never used smokeless tobacco. He reports that he drinks alcohol. He reports that he does not use drugs.  Allergies: No Known Allergies  No prescriptions prior to admission.    No results found for this or any previous visit (from the past 48 hour(s)). No results found.  Review of Systems  All other systems reviewed and are negative.   Blood pressure 124/76, pulse 64, temperature 97.7 F (36.5 C), temperature source Oral, resp. rate 14, height 6\' 6"  (1.981 m), weight 86.2 kg (190 lb), SpO2 98 %. Physical Exam  Constitutional: He is oriented to person, place, and time. He appears well-developed and well-nourished.  HENT:  Head: Atraumatic.  Eyes: EOM are normal.  Cardiovascular: Intact distal pulses.   Respiratory: Effort normal.  Musculoskeletal:  R shoulder pain and apprehension  Neurological: He is alert and oriented to person, place, and time.  Skin: Skin is warm and dry.  Psychiatric: He has a normal mood and affect.     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  Failed conservative treatment. Plan R coracoid transfer Risks / benefits of surgery discussed Consent on chart  NPO for OR Preop antibiotics   Mable ParisHANDLER,Senica Crall WILLIAM, MD 12/31/2015, 11:40 AM

## 2016-01-01 ENCOUNTER — Encounter (HOSPITAL_BASED_OUTPATIENT_CLINIC_OR_DEPARTMENT_OTHER): Payer: Self-pay | Admitting: Orthopedic Surgery

## 2016-01-01 NOTE — Transfer of Care (Signed)
Immediate Anesthesia Transfer of Care Note  Patient: Ryan Guerra  Procedure(s) Performed: Procedure(s): OPEN CORACOID TRANSFER (Right)  Patient Location: PACU  Anesthesia Type:General  Level of Consciousness: awake, alert  and oriented  Airway & Oxygen Therapy: Patient Spontanous Breathing and Patient connected to face mask oxygen  Post-op Assessment: Report given to RN and Post -op Vital signs reviewed and stable  Post vital signs: Reviewed and stable  Last Vitals:  Vitals:   12/31/15 1515 12/31/15 1532  BP: 129/86 (!) 144/86  Pulse: 70 78  Resp: 17   Temp:  36.9 C    Last Pain:  Vitals:   12/31/15 1532  TempSrc:   PainSc: 0-No pain         Complications: No apparent anesthesia complications

## 2016-01-16 ENCOUNTER — Other Ambulatory Visit: Payer: Self-pay | Admitting: Orthopedic Surgery

## 2016-01-16 ENCOUNTER — Ambulatory Visit (HOSPITAL_COMMUNITY): Payer: Medicaid Other | Admitting: Anesthesiology

## 2016-01-16 ENCOUNTER — Inpatient Hospital Stay (HOSPITAL_COMMUNITY)
Admission: RE | Admit: 2016-01-16 | Discharge: 2016-01-19 | DRG: 857 | Disposition: A | Discharge status: Home Health | Payer: Medicaid Other | Source: Ambulatory Visit | Attending: Orthopedic Surgery | Admitting: Orthopedic Surgery

## 2016-01-16 ENCOUNTER — Encounter (HOSPITAL_COMMUNITY)
Admission: RE | Disposition: A | Discharge status: Home Health | Payer: Self-pay | Source: Ambulatory Visit | Attending: Orthopedic Surgery

## 2016-01-16 ENCOUNTER — Encounter (HOSPITAL_COMMUNITY): Payer: Self-pay | Admitting: *Deleted

## 2016-01-16 DIAGNOSIS — Y838 Other surgical procedures as the cause of abnormal reaction of the patient, or of later complication, without mention of misadventure at the time of the procedure: Secondary | ICD-10-CM | POA: Diagnosis present

## 2016-01-16 DIAGNOSIS — T814XXA Infection following a procedure, initial encounter: Principal | ICD-10-CM | POA: Diagnosis present

## 2016-01-16 DIAGNOSIS — T8140XA Infection following a procedure, unspecified, initial encounter: Secondary | ICD-10-CM | POA: Diagnosis present

## 2016-01-16 DIAGNOSIS — F319 Bipolar disorder, unspecified: Secondary | ICD-10-CM | POA: Diagnosis present

## 2016-01-16 DIAGNOSIS — M009 Pyogenic arthritis, unspecified: Secondary | ICD-10-CM

## 2016-01-16 DIAGNOSIS — Z113 Encounter for screening for infections with a predominantly sexual mode of transmission: Secondary | ICD-10-CM

## 2016-01-16 DIAGNOSIS — L818 Other specified disorders of pigmentation: Secondary | ICD-10-CM

## 2016-01-16 DIAGNOSIS — F17210 Nicotine dependence, cigarettes, uncomplicated: Secondary | ICD-10-CM | POA: Diagnosis present

## 2016-01-16 HISTORY — PX: IRRIGATION AND DEBRIDEMENT SHOULDER: SHX5880

## 2016-01-16 HISTORY — DX: Bipolar disorder, unspecified: F31.9

## 2016-01-16 HISTORY — PX: INCISION AND DRAINAGE: SHX5863

## 2016-01-16 LAB — CBC
HCT: 42.7 % (ref 39.0–52.0)
Hemoglobin: 15.4 g/dL (ref 13.0–17.0)
MCH: 30.4 pg (ref 26.0–34.0)
MCHC: 36.1 g/dL — AB (ref 30.0–36.0)
MCV: 84.4 fL (ref 78.0–100.0)
PLATELETS: 328 10*3/uL (ref 150–400)
RBC: 5.06 MIL/uL (ref 4.22–5.81)
RDW: 11.6 % (ref 11.5–15.5)
WBC: 11.3 10*3/uL — ABNORMAL HIGH (ref 4.0–10.5)

## 2016-01-16 SURGERY — IRRIGATION AND DEBRIDEMENT SHOULDER
Anesthesia: General | Site: Shoulder | Laterality: Right

## 2016-01-16 MED ORDER — MEPERIDINE HCL 25 MG/ML IJ SOLN
INTRAMUSCULAR | Status: AC
  Filled 2016-01-16: qty 1

## 2016-01-16 MED ORDER — HYDROMORPHONE HCL 2 MG/ML IJ SOLN
INTRAMUSCULAR | Status: AC
  Filled 2016-01-16: qty 1

## 2016-01-16 MED ORDER — SODIUM CHLORIDE 0.9 % IR SOLN
Status: DC | PRN
  Administered 2016-01-16: 3000 mL

## 2016-01-16 MED ORDER — OXYCODONE HCL 5 MG PO TABS
ORAL_TABLET | ORAL | Status: AC
  Filled 2016-01-16: qty 2

## 2016-01-16 MED ORDER — LACTATED RINGERS IV SOLN
INTRAVENOUS | Status: DC | PRN
  Administered 2016-01-16: 16:00:00 via INTRAVENOUS

## 2016-01-16 MED ORDER — ACETAMINOPHEN 500 MG PO TABS
1000.0000 mg | ORAL_TABLET | Freq: Four times a day (QID) | ORAL | Status: AC
  Administered 2016-01-16 – 2016-01-17 (×4): 1000 mg via ORAL
  Filled 2016-01-16 (×4): qty 2

## 2016-01-16 MED ORDER — SODIUM CHLORIDE 0.9 % IV SOLN
1500.0000 mg | Freq: Two times a day (BID) | INTRAVENOUS | Status: DC
  Administered 2016-01-17 – 2016-01-19 (×5): 1500 mg via INTRAVENOUS
  Filled 2016-01-16 (×6): qty 1500

## 2016-01-16 MED ORDER — OXYCODONE HCL 5 MG PO TABS
5.0000 mg | ORAL_TABLET | ORAL | Status: DC | PRN
  Administered 2016-01-16 – 2016-01-17 (×3): 10 mg via ORAL
  Administered 2016-01-17: 5 mg via ORAL
  Administered 2016-01-17 – 2016-01-19 (×13): 10 mg via ORAL
  Filled 2016-01-16 (×16): qty 2

## 2016-01-16 MED ORDER — ONDANSETRON HCL 4 MG PO TABS: 4.0000 mg | ORAL_TABLET | Freq: Four times a day (QID) | ORAL | Status: DC | PRN

## 2016-01-16 MED ORDER — VANCOMYCIN HCL IN DEXTROSE 750-5 MG/150ML-% IV SOLN
750.0000 mg | Freq: Once | INTRAVENOUS | Status: AC
  Administered 2016-01-16: 750 mg via INTRAVENOUS
  Filled 2016-01-16 (×2): qty 150

## 2016-01-16 MED ORDER — ZOLPIDEM TARTRATE 5 MG PO TABS
5.0000 mg | ORAL_TABLET | Freq: Every evening | ORAL | Status: DC | PRN
  Administered 2016-01-17 – 2016-01-18 (×3): 5 mg via ORAL
  Filled 2016-01-16 (×3): qty 1

## 2016-01-16 MED ORDER — BUPIVACAINE HCL (PF) 0.25 % IJ SOLN
INTRAMUSCULAR | Status: AC
  Filled 2016-01-16: qty 30

## 2016-01-16 MED ORDER — MENTHOL 3 MG MT LOZG: 1.0000 | LOZENGE | OROMUCOSAL | Status: DC | PRN

## 2016-01-16 MED ORDER — ONDANSETRON HCL 4 MG/2ML IJ SOLN
INTRAMUSCULAR | Status: DC | PRN
  Administered 2016-01-16: 4 mg via INTRAVENOUS

## 2016-01-16 MED ORDER — LACTATED RINGERS IV SOLN
INTRAVENOUS | Status: DC
  Administered 2016-01-16: 16:00:00 via INTRAVENOUS

## 2016-01-16 MED ORDER — PROMETHAZINE HCL 25 MG/ML IJ SOLN: 6.2500 mg | INTRAMUSCULAR | Status: DC | PRN

## 2016-01-16 MED ORDER — DIPHENHYDRAMINE HCL 12.5 MG/5ML PO ELIX
ORAL_SOLUTION | ORAL | Status: AC
  Filled 2016-01-16: qty 10

## 2016-01-16 MED ORDER — MIDAZOLAM HCL 2 MG/2ML IJ SOLN
INTRAMUSCULAR | Status: AC
  Filled 2016-01-16: qty 2

## 2016-01-16 MED ORDER — METOCLOPRAMIDE HCL 5 MG PO TABS: 5.0000 mg | ORAL_TABLET | Freq: Three times a day (TID) | ORAL | Status: DC | PRN

## 2016-01-16 MED ORDER — OXYCODONE HCL 5 MG/5ML PO SOLN: 5.0000 mg | Freq: Once | ORAL | Status: DC | PRN

## 2016-01-16 MED ORDER — DOCUSATE SODIUM 100 MG PO CAPS
100.0000 mg | ORAL_CAPSULE | Freq: Two times a day (BID) | ORAL | Status: DC
  Administered 2016-01-16 – 2016-01-19 (×6): 100 mg via ORAL
  Filled 2016-01-16 (×6): qty 1

## 2016-01-16 MED ORDER — ALUMINUM HYDROXIDE GEL 320 MG/5ML PO SUSP
15.0000 mL | ORAL | Status: DC | PRN
  Filled 2016-01-16: qty 30

## 2016-01-16 MED ORDER — FENTANYL CITRATE (PF) 100 MCG/2ML IJ SOLN
INTRAMUSCULAR | Status: AC
  Filled 2016-01-16: qty 2

## 2016-01-16 MED ORDER — PROPOFOL 10 MG/ML IV BOLUS
INTRAVENOUS | Status: AC
  Filled 2016-01-16: qty 20

## 2016-01-16 MED ORDER — OXYCODONE HCL 5 MG PO TABS: 5.0000 mg | ORAL_TABLET | Freq: Once | ORAL | Status: DC | PRN

## 2016-01-16 MED ORDER — VANCOMYCIN HCL 1000 MG IV SOLR
INTRAVENOUS | Status: DC | PRN
  Administered 2016-01-16: 1000 mg via INTRAVENOUS

## 2016-01-16 MED ORDER — PHENOL 1.4 % MT LIQD: 1.0000 | OROMUCOSAL | Status: DC | PRN

## 2016-01-16 MED ORDER — MEPERIDINE HCL 25 MG/ML IJ SOLN
6.2500 mg | INTRAMUSCULAR | Status: DC | PRN
  Administered 2016-01-16: 12.5 mg via INTRAVENOUS

## 2016-01-16 MED ORDER — SUCCINYLCHOLINE CHLORIDE 20 MG/ML IJ SOLN
INTRAMUSCULAR | Status: DC | PRN
  Administered 2016-01-16: 80 mg via INTRAVENOUS

## 2016-01-16 MED ORDER — FLEET ENEMA 7-19 GM/118ML RE ENEM: 1.0000 | ENEMA | Freq: Once | RECTAL | Status: DC | PRN

## 2016-01-16 MED ORDER — HYDROMORPHONE HCL 1 MG/ML IJ SOLN
0.2500 mg | INTRAMUSCULAR | Status: DC | PRN
  Administered 2016-01-16 (×3): 0.5 mg via INTRAVENOUS

## 2016-01-16 MED ORDER — DIPHENHYDRAMINE HCL 12.5 MG/5ML PO ELIX
12.5000 mg | ORAL_SOLUTION | ORAL | Status: DC | PRN
  Administered 2016-01-16: 25 mg via ORAL

## 2016-01-16 MED ORDER — PROPOFOL 10 MG/ML IV BOLUS
INTRAVENOUS | Status: DC | PRN
  Administered 2016-01-16: 200 mg via INTRAVENOUS

## 2016-01-16 MED ORDER — SODIUM CHLORIDE 0.9 % IV SOLN
INTRAVENOUS | Status: DC
  Administered 2016-01-16 – 2016-01-18 (×5): via INTRAVENOUS

## 2016-01-16 MED ORDER — BISACODYL 5 MG PO TBEC
5.0000 mg | DELAYED_RELEASE_TABLET | Freq: Every day | ORAL | Status: DC | PRN
Start: 2016-01-16 — End: 2016-01-19

## 2016-01-16 MED ORDER — METOCLOPRAMIDE HCL 5 MG/ML IJ SOLN: 5.0000 mg | Freq: Three times a day (TID) | INTRAMUSCULAR | Status: DC | PRN

## 2016-01-16 MED ORDER — VANCOMYCIN HCL IN DEXTROSE 1-5 GM/200ML-% IV SOLN
INTRAVENOUS | Status: AC
  Filled 2016-01-16: qty 200

## 2016-01-16 MED ORDER — ONDANSETRON HCL 4 MG/2ML IJ SOLN: 4.0000 mg | Freq: Four times a day (QID) | INTRAMUSCULAR | Status: DC | PRN

## 2016-01-16 MED ORDER — MORPHINE SULFATE (PF) 2 MG/ML IV SOLN
1.0000 mg | INTRAVENOUS | Status: DC | PRN
  Administered 2016-01-17 – 2016-01-18 (×3): 1 mg via INTRAVENOUS
  Filled 2016-01-16 (×3): qty 1

## 2016-01-16 MED ORDER — POLYETHYLENE GLYCOL 3350 17 G PO PACK
17.0000 g | PACK | Freq: Every day | ORAL | Status: DC | PRN
Start: 2016-01-16 — End: 2016-01-19
  Filled 2016-01-16: qty 1

## 2016-01-16 MED ORDER — LIDOCAINE HCL (CARDIAC) 20 MG/ML IV SOLN
INTRAVENOUS | Status: DC | PRN
  Administered 2016-01-16: 100 mg via INTRAVENOUS

## 2016-01-16 MED ORDER — POVIDONE-IODINE 7.5 % EX SOLN: Freq: Once | CUTANEOUS | Status: DC

## 2016-01-16 SURGICAL SUPPLY — 43 items
BOOTCOVER CLEANROOM LRG (PROTECTIVE WEAR) ×6 IMPLANT
CHLORAPREP W/TINT 26ML (MISCELLANEOUS) ×3 IMPLANT
CONT SPEC 4OZ CLIKSEAL STRL BL (MISCELLANEOUS) ×3 IMPLANT
COVER SURGICAL LIGHT HANDLE (MISCELLANEOUS) ×3 IMPLANT
DRAPE INCISE IOBAN 66X45 STRL (DRAPES) ×6 IMPLANT
DRAPE ORTHO SPLIT 77X108 STRL (DRAPES) ×4
DRAPE SURG ORHT 6 SPLT 77X108 (DRAPES) ×2 IMPLANT
DRAPE U-SHAPE 47X51 STRL (DRAPES) ×3 IMPLANT
DRSG PAD ABDOMINAL 8X10 ST (GAUZE/BANDAGES/DRESSINGS) ×3 IMPLANT
GAUZE SPONGE 4X4 12PLY STRL (GAUZE/BANDAGES/DRESSINGS) ×3 IMPLANT
GAUZE XEROFORM 1X8 LF (GAUZE/BANDAGES/DRESSINGS) ×3 IMPLANT
GLOVE BIO SURGEON STRL SZ7 (GLOVE) ×3 IMPLANT
GLOVE BIOGEL PI IND STRL 8 (GLOVE) ×1 IMPLANT
GLOVE BIOGEL PI INDICATOR 8 (GLOVE) ×2
GLOVE ORTHO TXT STRL SZ7.5 (GLOVE) ×3 IMPLANT
GOWN STRL REUS W/ TWL LRG LVL3 (GOWN DISPOSABLE) ×1 IMPLANT
GOWN STRL REUS W/ TWL XL LVL3 (GOWN DISPOSABLE) ×1 IMPLANT
GOWN STRL REUS W/TWL LRG LVL3 (GOWN DISPOSABLE) ×2
GOWN STRL REUS W/TWL XL LVL3 (GOWN DISPOSABLE) ×2
HANDPIECE INTERPULSE COAX TIP (DISPOSABLE) ×2
KIT BASIN OR (CUSTOM PROCEDURE TRAY) ×3 IMPLANT
KIT ROOM TURNOVER OR (KITS) ×3 IMPLANT
MANIFOLD NEPTUNE II (INSTRUMENTS) ×3 IMPLANT
NS IRRIG 1000ML POUR BTL (IV SOLUTION) ×3 IMPLANT
PACK SHOULDER (CUSTOM PROCEDURE TRAY) ×3 IMPLANT
PAD ARMBOARD 7.5X6 YLW CONV (MISCELLANEOUS) ×3 IMPLANT
SET HNDPC FAN SPRY TIP SCT (DISPOSABLE) ×1 IMPLANT
SLING ARM IMMOBILIZER LRG (SOFTGOODS) ×3 IMPLANT
SPONGE GAUZE 4X4 12PLY STER LF (GAUZE/BANDAGES/DRESSINGS) ×3 IMPLANT
SPONGE LAP 18X18 X RAY DECT (DISPOSABLE) ×3 IMPLANT
SUT ETHILON 2 0 FS 18 (SUTURE) ×9 IMPLANT
SUT ETHILON 3 0 PS 1 (SUTURE) ×3 IMPLANT
SUT VIC AB 0 CT1 27 (SUTURE) ×2
SUT VIC AB 0 CT1 27XBRD ANBCTR (SUTURE) ×1 IMPLANT
SUT VIC AB 2-0 CT1 27 (SUTURE) ×4
SUT VIC AB 2-0 CT1 TAPERPNT 27 (SUTURE) ×2 IMPLANT
SWAB COLLECTION DEVICE MRSA (MISCELLANEOUS) ×3 IMPLANT
TAPE CLOTH SURG 6X10 WHT LF (GAUZE/BANDAGES/DRESSINGS) ×3 IMPLANT
TOWEL OR 17X24 6PK STRL BLUE (TOWEL DISPOSABLE) ×6 IMPLANT
TUBE CONNECTING 12'X1/4 (SUCTIONS) ×1
TUBE CONNECTING 12X1/4 (SUCTIONS) ×2 IMPLANT
WATER STERILE IRR 1000ML POUR (IV SOLUTION) ×3 IMPLANT
YANKAUER SUCT BULB TIP NO VENT (SUCTIONS) ×3 IMPLANT

## 2016-01-16 NOTE — Anesthesia Preprocedure Evaluation (Signed)
Anesthesia Evaluation  Patient identified by MRN, date of birth, ID band Patient awake    Reviewed: Allergy & Precautions, NPO status , Patient's Chart, lab work & pertinent test results  Airway Mallampati: I  TM Distance: >3 FB Neck ROM: Full    Dental  (+) Teeth Intact, Dental Advisory Given   Pulmonary Current Smoker,    breath sounds clear to auscultation       Cardiovascular negative cardio ROS   Rhythm:Regular Rate:Normal     Neuro/Psych negative neurological ROS  negative psych ROS   GI/Hepatic Neg liver ROS, GERD  ,  Endo/Other  negative endocrine ROS  Renal/GU negative Renal ROS     Musculoskeletal negative musculoskeletal ROS (+)   Abdominal   Peds  Hematology negative hematology ROS (+)   Anesthesia Other Findings   Reproductive/Obstetrics negative OB ROS                             Lab Results  Component Value Date   WBC 20.2 (H) 11/18/2014   HGB 15.2 11/18/2014   HCT 43.4 11/18/2014   MCV 86.5 11/18/2014   PLT 187 11/18/2014   Lab Results  Component Value Date   CREATININE 1.09 11/18/2014   BUN 16 11/18/2014   NA 136 11/18/2014   K 3.7 11/18/2014   CL 102 11/18/2014   CO2 23 11/18/2014   No results found for: INR, PROTIME   06/2015 EKG: normal sinus rhythm.  Anesthesia Physical  Anesthesia Plan  ASA: II  Anesthesia Plan: General   Post-op Pain Management:    Induction: Intravenous  Airway Management Planned: LMA and Oral ETT  Additional Equipment:   Intra-op Plan:   Post-operative Plan: Extubation in OR  Informed Consent: I have reviewed the patients History and Physical, chart, labs and discussed the procedure including the risks, benefits and alternatives for the proposed anesthesia with the patient or authorized representative who has indicated his/her understanding and acceptance.   Dental advisory given  Plan Discussed with:  CRNA  Anesthesia Plan Comments:       Anesthesia Quick Evaluation

## 2016-01-16 NOTE — Progress Notes (Signed)
ANTIBIOTIC CONSULT NOTE - INITIAL  Pharmacy Consult for vancomycin Indication: wound infection  No Known Allergies  Patient Measurements: Height: 6\' 7"  (200.7 cm) Weight: 200 lb (90.7 kg) IBW/kg (Calculated) : 93.7   Vital Signs: Temp: 98.3 F (36.8 C) (11/22 1832) Temp Source: Oral (11/22 1553) BP: 121/75 (11/22 1832) Pulse Rate: 84 (11/22 1832) Intake/Output from previous day: No intake/output data recorded. Intake/Output from this shift: No intake/output data recorded.  Labs:  Recent Labs  01/16/16 1559  WBC 11.3*  HGB 15.4  PLT 328   CrCl cannot be calculated (Patient's most recent lab result is older than the maximum 21 days allowed.). No results for input(s): VANCOTROUGH, VANCOPEAK, VANCORANDOM, GENTTROUGH, GENTPEAK, GENTRANDOM, TOBRATROUGH, TOBRAPEAK, TOBRARND, AMIKACINPEAK, AMIKACINTROU, AMIKACIN in the last 72 hours.   Microbiology: No results found for this or any previous visit (from the past 720 hour(s)).  Medical History: Past Medical History:  Diagnosis Date  . GERD (gastroesophageal reflux disease)    no current med.  Altamese Cabal. Runny nose 12/25/2015   clear drainage, per pt.  . 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 Sore throat 12/25/2015    Medications:  No prescriptions prior to admission.   Scheduled:  . acetaminophen  1,000 mg Oral Q6H  . diphenhydrAMINE      . docusate sodium  100 mg Oral BID  . HYDROmorphone      . meperidine      . oxyCODONE      . vancomycin       Assessment: 25 yo male with R shoulder post-op infection s/p I&D on 11/22. Pharmacy consulted to dose vancomycin for a wound infection.  Vancomycin 1000mg  IV was given at about 6pm today -last SCr was 1.09 in 2016  Goal of Therapy:  Vancomycin trough level 10-15 mcg/ml  Plan:  -Vancomycin 750mg  IV now (to complete a load of 1750mg  IV x1 then  1500mg  IV q12h -BMET in am -Will follow renal function, cultures and clinical progress  Harland Germanndrew Keygan Dumond, Pharm D 01/16/2016 7:15  PM

## 2016-01-16 NOTE — Anesthesia Procedure Notes (Signed)
Procedure Name: Intubation Date/Time: 01/16/2016 4:51 PM Performed by: Gwenyth AllegraADAMI, Hasheem Voland Pre-anesthesia Checklist: Patient identified, Emergency Drugs available, Suction available, Patient being monitored and Timeout performed Patient Re-evaluated:Patient Re-evaluated prior to inductionOxygen Delivery Method: Circle system utilized Preoxygenation: Pre-oxygenation with 100% oxygen Intubation Type: IV induction Ventilation: Mask ventilation without difficulty Laryngoscope Size: Mac and 4 Grade View: Grade I Tube type: Oral Number of attempts: 1 Placement Confirmation: ETT inserted through vocal cords under direct vision,  positive ETCO2 and breath sounds checked- equal and bilateral Secured at: 23 cm Tube secured with: Tape Dental Injury: Teeth and Oropharynx as per pre-operative assessment

## 2016-01-16 NOTE — Progress Notes (Signed)
Pt. Arrived from PACU. No complaints. Placed pt. On clear liquid diet and provided him with some beef broth and sprite. Tolerating well. Pt. States no needs at this time.

## 2016-01-16 NOTE — Op Note (Signed)
Procedure(s): IRRIGATION AND DEBRIDEMENT SHOULDER Procedure Note  Ryan Guerra male 25 y.o. 01/16/2016  Procedure(s) and Anesthesia Type:    * IRRIGATION AND DEBRIDEMENT RIGHT SHOULDER - Choice  Surgeon(s) and Role:    * Jones BroomJustin Montrice Montuori, MD - Primary   Indications:  25 y.o. male     Surgeon: Mable ParisHANDLER,Marae Cottrell WILLIAM   Assistants: Damita Lackanielle Lalibert PA-C (Danielle was present and scrubbed throughout the procedure and was essential in positioning, retraction, exposure, and closure)  Anesthesia: General endotracheal anesthesia    Procedure Detail  IRRIGATION AND DEBRIDEMENT SHOULDER  Findings: Gross purulence. Screws and bone graft intact. No apparent joint damage, but the infection did go all the way down to the joint  Estimated Blood Loss:  less than 50 mL         Drains: none  Blood Given: none         Specimens: none        Complications:  * No complications entered in OR log *         Disposition: PACU - hemodynamically stable.         Condition: stable    Procedure:  DESCRIPTION OF PROCEDURE: The patient was identified in preoperative  holding area where I personally marked the operative site after  verifying site, side, and procedure with the patient. The patient was taken back  to the operating room where general anesthesia was induced without  complication and was placed in the beach-chair position with the back  elevated about 30 degrees and all extremities carefully padded and  positioned. The right upper extremity was then prepped and  draped in a standard sterile fashion. The appropriate time-out  procedure was carried out. Antibiotics were held.  The previous incision was opened sharply with significant amount of gross purulence. This was collected and sent for stat Gram stain and culture. A fair amount this was in the subcutaneous layers but with finger dissection was easily able to define the deltopectoral interval and deep to this there was  also purulence. In the deep layers identified the bone graft and screw heads which showed no signs of loosening. I was able to visualize the glenohumeral joint. At this point copious irrigation was used with pulse lavage with a total of 3 L with approximately 1 L in the joint 1 L in the deep deltopectoral interval, and 1 L in the subcutaneous space. All previously placed suture material was removed. Screws were left in place. After irrigation the wound bed was visually clean. 2 medium Hemovac drains were placed with one in the glenohumeral joint deeply and one more superficially. The deep drain was labeled #1 superficial drain was labeled #2. At this point the wound was closed in one simple layer with 2-0 nylon in a horizontal mattress configuration. A bulky sterile dressing was applied. The patient was then allowed to awaken from anesthesia transferred to stretcher and taken to the recovery room in stable condition   POSTOPERATIVE PLAN: He'll be admitted for IV antibiotics. We will await cultures and will plan on placing a PICC line for long-term IV antibiotics given the deep space infection. We will consult infectious disease.

## 2016-01-16 NOTE — H&P (Signed)
[name] is an 25 y.o. male.   Chief Complaint: R shoulder pain/ swelling.  HPI:  2wks s/p R shoulder surgery with purulent infection.  Past Medical History:  Diagnosis Date  . GERD (gastroesophageal reflux disease)    no current med.  Altamese Cabal. Runny nose 12/25/2015   clear drainage, per pt.  . 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 Sore throat 12/25/2015    Past Surgical History:  Procedure Laterality Date  . SHOULDER LATERJET Right 12/31/2015   Procedure: OPEN CORACOID TRANSFER;  Surgeon: Jones BroomJustin Regis Wiland, MD;  Location: [facility];  Service: Orthopedics;  Laterality: Right;  . 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 No pertinent family history. Social History:  reports that he has been smoking Cigarettes.  He has smoked for the past 8.00 years. He has never used smokeless tobacco. He reports that he drinks about 1.2 oz of alcohol per week . He reports that he does not use drugs.  Allergies: No Known Allergies  No prescriptions prior to admission.    Results for orders placed or performed during the hospital encounter of 01/16/16 (from the past 48 hour(s))  CBC     Status: Abnormal   Collection Time: 01/16/16  3:59 PM  Result Value Ref Range   WBC 11.3 (H) 4.0 - 10.5 K/uL   RBC 5.06 4.22 - 5.81 MIL/uL   Hemoglobin 15.4 13.0 - 17.0 g/dL   HCT 40.942.7 81.139.0 - 91.452.0 %   MCV 84.4 78.0 - 100.0 fL   MCH 30.4 26.0 - 34.0 pg   MCHC 36.1 (H) 30.0 - 36.0 g/dL   RDW 78.211.6 95.611.5 - 21.315.5 %   Platelets 328 150 - 400 K/uL   No results found.  Review of Systems  Constitutional: Positive for chills and fever.    Blood pressure 132/82, pulse 87, temperature 98 F (36.7 C), temperature source Oral, resp. rate 18, height 6\' 7"  (2.007 m), weight 90.7 kg (200 lb), SpO2 99 %. Physical Exam  Constitutional: He appears well-developed and well-nourished.  HENT:  Head: Atraumatic.  Eyes: EOM are normal.  Cardiovascular: Intact distal pulses.    Respiratory: Effort normal.  Musculoskeletal:  R shoulder anterior swelling, erythematous, no drainage.  Neurological: He is alert.  Skin: Skin is warm and dry.  Psychiatric: He has a normal mood and affect.     Assessment/Plan R shoulder postop infection Plan I&D, drains, IV abx Risks / benefits of surgery discussed Consent on chart  NPO for OR Preop antibiotics   Mable ParisHANDLER,Rickiya Picariello WILLIAM, MD 01/16/2016, 4:31 PM

## 2016-01-16 NOTE — Transfer of Care (Signed)
Immediate Anesthesia Transfer of Care Note  Patient: Ryan Guerra  Procedure(s) Performed: Procedure(s) with comments: IRRIGATION AND DEBRIDEMENT SHOULDER (Right) - Irrigation and debridement right shoulder infection  Patient Location: PACU  Anesthesia Type:General  Level of Consciousness: awake, alert  and oriented  Airway & Oxygen Therapy: Patient Spontanous Breathing and Patient connected to nasal cannula oxygen  Post-op Assessment: Report given to RN and Post -op Vital signs reviewed and stable  Post vital signs: Reviewed and stable  Last Vitals:  Vitals:   01/16/16 1553 01/16/16 1753  BP: 132/82   Pulse: 87   Resp: 18 18  Temp: 36.7 C 36.6 C    Last Pain:  Vitals:   01/16/16 1553  TempSrc: Oral         Complications: No apparent anesthesia complications

## 2016-01-17 DIAGNOSIS — M00811 Arthritis due to other bacteria, right shoulder: Secondary | ICD-10-CM

## 2016-01-17 DIAGNOSIS — Z113 Encounter for screening for infections with a predominantly sexual mode of transmission: Secondary | ICD-10-CM

## 2016-01-17 DIAGNOSIS — F319 Bipolar disorder, unspecified: Secondary | ICD-10-CM | POA: Diagnosis present

## 2016-01-17 DIAGNOSIS — B9689 Other specified bacterial agents as the cause of diseases classified elsewhere: Secondary | ICD-10-CM

## 2016-01-17 DIAGNOSIS — M009 Pyogenic arthritis, unspecified: Secondary | ICD-10-CM | POA: Diagnosis present

## 2016-01-17 DIAGNOSIS — T814XXD Infection following a procedure, subsequent encounter: Secondary | ICD-10-CM

## 2016-01-17 DIAGNOSIS — F17210 Nicotine dependence, cigarettes, uncomplicated: Secondary | ICD-10-CM | POA: Diagnosis present

## 2016-01-17 DIAGNOSIS — T814XXA Infection following a procedure, initial encounter: Secondary | ICD-10-CM | POA: Diagnosis present

## 2016-01-17 DIAGNOSIS — Y838 Other surgical procedures as the cause of abnormal reaction of the patient, or of later complication, without mention of misadventure at the time of the procedure: Secondary | ICD-10-CM | POA: Diagnosis present

## 2016-01-17 DIAGNOSIS — T8140XA Infection following a procedure, unspecified, initial encounter: Secondary | ICD-10-CM | POA: Diagnosis present

## 2016-01-17 DIAGNOSIS — L818 Other specified disorders of pigmentation: Secondary | ICD-10-CM

## 2016-01-17 LAB — BASIC METABOLIC PANEL
Anion gap: 6 (ref 5–15)
BUN: 9 mg/dL (ref 6–20)
CALCIUM: 8.3 mg/dL — AB (ref 8.9–10.3)
CO2: 30 mmol/L (ref 22–32)
CREATININE: 0.89 mg/dL (ref 0.61–1.24)
Chloride: 103 mmol/L (ref 101–111)
GFR calc Af Amer: >60 mL/min (ref >60)
GLUCOSE: 91 mg/dL (ref 65–99)
Potassium: 3.5 mmol/L (ref 3.5–5.1)
Sodium: 139 mmol/L (ref 135–145)

## 2016-01-17 LAB — CBC
HEMATOCRIT: 35.9 % — AB (ref 39.0–52.0)
Hemoglobin: 12.2 g/dL — ABNORMAL LOW (ref 13.0–17.0)
MCH: 29.1 pg (ref 26.0–34.0)
MCHC: 34 g/dL (ref 30.0–36.0)
MCV: 85.7 fL (ref 78.0–100.0)
PLATELETS: 288 10*3/uL (ref 150–400)
RBC: 4.19 MIL/uL — ABNORMAL LOW (ref 4.22–5.81)
RDW: 11.7 % (ref 11.5–15.5)
WBC: 8.3 10*3/uL (ref 4.0–10.5)

## 2016-01-17 NOTE — Progress Notes (Signed)
   PATIENT ID: Ryan Guerra   1 Day Post-Op Procedure(s) (LRB): IRRIGATION AND DEBRIDEMENT SHOULDER (Right)  Subjective: Pain mild.  Feeling better.  Objective:  Vitals:   01/17/16 0200 01/17/16 0444  BP: 109/66 (!) 110/58  Pulse: 79 70  Resp: 16 16  Temp: 97.5 F (36.4 C) 98 F (36.7 C)     R shoulder dressing C/D/I NVID Drains intact and draining seropurulent  Labs:   Recent Labs  01/16/16 1559 01/17/16 0340  HGB 15.4 12.2*   Recent Labs  01/16/16 1559 01/17/16 0340  WBC 11.3* 8.3  RBC 5.06 4.19*  HCT 42.7 35.9*  PLT 328 288   Recent Labs  01/17/16 0340  NA 139  K 3.5  CL 103  CO2 30  BUN 9  CREATININE 0.89  GLUCOSE 91  CALCIUM 8.3*    Assessment and Plan:POD1 s/p I&D R shoulder infection Cont vanc for now, ID consult pending, await cultures.  Likely will need PICC Continue drains.

## 2016-01-17 NOTE — Consult Note (Addendum)
Date of Admission:  01/16/2016  Date of Consult:  01/17/2016  Reason for Consult: Infected right shoulder Referring Physician: Dr Tamera Punt   HPI: Ryan Guerra is an 25 y.o. male who underwent an open coracoid transfer shoulder surgery on 12/31/2015 by Dr. Tamera Punt. His course has been complicated by swelling and redness of the site with concern for infection. This site was evaluated by Dr. Tamera Punt in his office who aspirated purulent material from this surgical site and sent this for culture to [facility]. He brought the patient to the operating room and performed irrigation and debridement of this pill material, the joint was not obviously damaged but infection did extend to this space. The screws and bone graft were intact. There is not obvious evidence of bone infection or loosening of the screws.  She and was placed on vancomycin.   Past Medical History:  Diagnosis Date  . Bipolar disorder ([place])   . GERD (gastroesophageal reflux disease)    no current med.  Sallee Provencal nose 12/25/2015   clear drainage, per pt.  . 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 Sore throat 12/25/2015    Past Surgical History:  Procedure Laterality Date  . INCISION AND DRAINAGE Right 01/16/2016   shoulder  . SHOULDER LATERJET Right 12/31/2015   Procedure: OPEN CORACOID TRANSFER;  Surgeon: Tania Ade, MD;  Location: [facility];  Service: Orthopedics;  Laterality: Right;  . 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  He has a 2.00 pack-year smoking history. He has quit using smokeless tobacco. He reports that he drinks about 0.6 oz of alcohol per week . He reports that he does not use drugs.   History reviewed. No pertinent family history.  No Known Allergies   Medications: I have reviewed patients current medications as documented in Epic Anti-infectives    Start     Dose/Rate Route Frequency  Ordered Stop   01/17/16 0800  vancomycin (VANCOCIN) 1,500 mg in sodium chloride 0.9 % 500 mL IVPB     1,500 mg 250 mL/hr over 120 Minutes Intravenous Every 12 hours 01/16/16 1921     01/16/16 2000  vancomycin (VANCOCIN) IVPB 750 mg/150 ml premix     750 mg 150 mL/hr over 60 Minutes Intravenous  Once 01/16/16 1921 01/16/16 2137   01/16/16 1631  vancomycin (VANCOCIN) 1-5 GM/200ML-% IVPB    Comments:  Laurita Quint   : cabinet override      01/16/16 1631 01/17/16 0444         ROS:  as in HPI otherwise remainder of 12 point Review of Systems is negative   Blood pressure 128/75, pulse 79, temperature 98.2 F (36.8 C), temperature source Oral, resp. rate 16, height '6\' 7"'  (2.007 m), weight 200 lb (90.7 kg), SpO2 98 %. General: Alert and awake, oriented x3, not in any acute distress. HEENT: anicteric sclera,  EOMI, oropharynx clear and without exudate Cardiovascular: regular rate, normal r,   Pulmonary:  no wheezing, respiratory distress Gastrointestinal: soft nontender, nondistended,  Musculoskeletal: Right arm and sling and shoulder with pads in place Skin, soft tissue: no rashes +tattoos Neuro: nonfocal, strength and sensation intact   Results for orders placed or performed during the hospital encounter of 01/16/16 (from the past 48 hour(s))  CBC     Status: Abnormal   Collection Time: 01/16/16  3:59 PM  Result Value Ref Range  WBC 11.3 (H) 4.0 - 10.5 K/uL   RBC 5.06 4.22 - 5.81 MIL/uL   Hemoglobin 15.4 13.0 - 17.0 g/dL   HCT 42.7 39.0 - 52.0 %   MCV 84.4 78.0 - 100.0 fL   MCH 30.4 26.0 - 34.0 pg   MCHC 36.1 (H) 30.0 - 36.0 g/dL   RDW 11.6 11.5 - 15.5 %   Platelets 328 150 - 400 K/uL  Aerobic/Anaerobic Culture (surgical/deep wound)     Status: None (Preliminary result)   Collection Time: 01/16/16  5:09 PM  Result Value Ref Range   Specimen Description ABSCESS RIGHT SHOULDER    Special Requests NONE    Gram Stain      ABUNDANT WBC PRESENT,BOTH PMN AND MONONUCLEAR NO  ORGANISMS SEEN    Culture NO GROWTH < 24 HOURS    Report Status PENDING   CBC     Status: Abnormal   Collection Time: 01/17/16  3:40 AM  Result Value Ref Range   WBC 8.3 4.0 - 10.5 K/uL   RBC 4.19 (L) 4.22 - 5.81 MIL/uL   Hemoglobin 12.2 (L) 13.0 - 17.0 g/dL    Comment: DELTA CHECK NOTED REPEATED TO VERIFY    HCT 35.9 (L) 39.0 - 52.0 %   MCV 85.7 78.0 - 100.0 fL   MCH 29.1 26.0 - 34.0 pg   MCHC 34.0 30.0 - 36.0 g/dL   RDW 11.7 11.5 - 15.5 %   Platelets 288 150 - 400 K/uL  Basic metabolic panel     Status: Abnormal   Collection Time: 01/17/16  3:40 AM  Result Value Ref Range   Sodium 139 135 - 145 mmol/L   Potassium 3.5 3.5 - 5.1 mmol/L   Chloride 103 101 - 111 mmol/L   CO2 30 22 - 32 mmol/L   Glucose, Bld 91 65 - 99 mg/dL   BUN 9 6 - 20 mg/dL   Creatinine, Ser 0.89 0.61 - 1.24 mg/dL   Calcium 8.3 (L) 8.9 - 10.3 mg/dL   GFR calc non Af Amer >60 >60 mL/min   GFR calc Af Amer >60 >60 mL/min    Comment: (NOTE) The eGFR has been calculated using the CKD EPI equation. This calculation has not been validated in all clinical situations. eGFR's persistently <60 mL/min signify possible Chronic Kidney Disease.    Anion gap 6 5 - 15   '@BRIEFLABTABLE' (sdes,specrequest,cult,reptstatus)   ) Recent Results (from the past 720 hour(s))  Aerobic/Anaerobic Culture (surgical/deep wound)     Status: None (Preliminary result)   Collection Time: 01/16/16  5:09 PM  Result Value Ref Range Status   Specimen Description ABSCESS RIGHT SHOULDER  Final   Special Requests NONE  Final   Gram Stain   Final    ABUNDANT WBC PRESENT,BOTH PMN AND MONONUCLEAR NO ORGANISMS SEEN    Culture NO GROWTH < 24 HOURS  Final   Report Status PENDING  Incomplete     Impression/Recommendation  Active Problems:   Post-operative infection   Ryan Guerra is a 25 y.o. male with  Infection after placement of wound graft with screws status post I&D in the operating room with infection by definition  extending into the joint space.  #1 Postoperative infection with septic joint: A called [facility] and they did have the material but it had not been set up for culture of 5 to stand correctly due to the fact that the specimen had not been labeled with a specific source. I told the source was the  shoulder and my understanding is they were now born to proceed with getting a preliminary report I gave them my cell phone number that should be: The back later today with initial data on his culture from the surgical office. We also have a culture from the OR that is incubating and accessible to [place] for review through Epic.  For now we'll continue IV vancomycin.  I would hold off on placement of the PICC until we have a better idea what the organism is.  He certainly is going to need a six-week course of parenteral or highly bile available antibiotics plus possibly some oral antibiotics if we have some anxiety about the screws which I have some anxiety just because they're there.  Explained all this thoroughly with the patient and he understands.  #2 screening I will screen for HIV as well as hepatitis B and hep C. Note someone tested him for gonorrhea and chlamydia in the last year without testing for HIV which is an absolute "no-no" all STIS particularly ones that need  be treated with lifelong therapy such as HIV should be tested together   01/17/2016, 12:52 PM   Thank you so much for this interesting consult  [place] [facility] [place] [phone] (pager) [phone] (office) 01/17/2016, 12:52 PM  [place] 01/17/2016, 12:52 PM   ADDENDUM:  His cultures are no growth at one daY from [facility]. The session number it's all status is:   [id number]

## 2016-01-18 ENCOUNTER — Encounter (HOSPITAL_COMMUNITY): Payer: Self-pay | Admitting: Orthopedic Surgery

## 2016-01-18 DIAGNOSIS — Z95828 Presence of other vascular implants and grafts: Secondary | ICD-10-CM

## 2016-01-18 LAB — C-REACTIVE PROTEIN: CRP: 11.1 mg/dL — ABNORMAL HIGH (ref <1.0)

## 2016-01-18 LAB — HIV ANTIBODY (ROUTINE TESTING W REFLEX): HIV SCREEN 4TH GENERATION: NONREACTIVE

## 2016-01-18 LAB — SEDIMENTATION RATE: Sed Rate: 47 mm/hr — ABNORMAL HIGH (ref 0–16)

## 2016-01-18 MED ORDER — ACETAMINOPHEN 325 MG PO TABS
650.0000 mg | ORAL_TABLET | Freq: Three times a day (TID) | ORAL | Status: DC | PRN
  Administered 2016-01-18: 650 mg via ORAL
  Filled 2016-01-18: qty 2

## 2016-01-18 MED ORDER — OXYCODONE-ACETAMINOPHEN 5-325 MG PO TABS: 1.0000 | ORAL_TABLET | ORAL | 0 refills | Status: DC | PRN

## 2016-01-18 MED ORDER — DEXTROSE 5 % IV SOLN
2.0000 g | INTRAVENOUS | Status: DC
  Administered 2016-01-18 – 2016-01-19 (×2): 2 g via INTRAVENOUS
  Filled 2016-01-18 (×2): qty 2

## 2016-01-18 NOTE — Progress Notes (Signed)
Subjective: No new complaints   Antibiotics:  Anti-infectives    Start     Dose/Rate Route Frequency Ordered Stop   01/18/16 1615  cefTRIAXone (ROCEPHIN) 2 g in dextrose 5 % 50 mL IVPB     2 g 100 mL/hr over 30 Minutes Intravenous Every 24 hours 01/18/16 1610     01/17/16 0800  vancomycin (VANCOCIN) 1,500 mg in sodium chloride 0.9 % 500 mL IVPB     1,500 mg 250 mL/hr over 120 Minutes Intravenous Every 12 hours 01/16/16 1921     01/16/16 2000  vancomycin (VANCOCIN) IVPB 750 mg/150 ml premix     750 mg 150 mL/hr over 60 Minutes Intravenous  Once 01/16/16 1921 01/16/16 2137   01/16/16 1631  vancomycin (VANCOCIN) 1-5 GM/200ML-% IVPB    Comments:  Laurita Quint   : cabinet override      01/16/16 1631 01/17/16 0444      Medications: Scheduled Meds: . cefTRIAXone (ROCEPHIN)  IV  2 g Intravenous Q24H  . docusate sodium  100 mg Oral BID  . vancomycin  1,500 mg Intravenous Q12H   Continuous Infusions: . sodium chloride 100 mL/hr at 01/18/16 1727   PRN Meds:.acetaminophen, aluminum hydroxide, bisacodyl, diphenhydrAMINE, menthol-cetylpyridinium **OR** phenol, metoCLOPramide **OR** metoCLOPramide (REGLAN) injection, morphine injection, ondansetron **OR** ondansetron (ZOFRAN) IV, oxyCODONE, polyethylene glycol, sodium phosphate, zolpidem    Objective: Weight change:   Intake/Output Summary (Last 24 hours) at 01/18/16 1922 Last data filed at 01/18/16 1839  Gross per 24 hour  Intake          2753.33 ml  Output              190 ml  Net          2563.33 ml   Blood pressure 121/72, pulse 80, temperature 98.1 F (36.7 C), temperature source Oral, resp. rate 16, height _0  (2.007 m), weight 200 lb (90.7 kg), SpO2 100 %. Temp:  [98.1 F (36.7 C)-98.8 F (37.1 C)] 98.1 F (36.7 C) (11/24 1418) Pulse Rate:  [74-91] 80 (11/24 1418) Resp:  [16-18] 16 (11/24 1418) BP: (120-131)/(65-72) 121/72 (11/24 1418) SpO2:  [98 %-100 %] 100 % (11/24 1418)  Physical Exam:  General:  Alert and awake, oriented x3, not in any acute distress. HEENT: anicteric sclera,  EOMI, oropharynx clear and without exudate Cardiovascular: regular rate, normal r,   Pulmonary:  no wheezing, respiratory distress Gastrointestinal: soft nontender, nondistended,  Musculoskeletal: Right arm and sling and shoulder with pads in place Skin, soft tissue: no rashes +tattoos Neuro: nonfocal, strength and sensation intact   CBC:  CBC Latest Ref Rng & Units 01/17/2016 01/16/2016 11/18/2014  WBC 4.0 - 10.5 K/uL 8.3 11.3(H) 20.2(H)  Hemoglobin 13.0 - 17.0 g/dL 12.2(L) 15.4 15.2  Hematocrit 39.0 - 52.0 % 35.9(L) 42.7 43.4  Platelets 150 - 400 K/uL 288 328 187     BMET  Recent Labs  01/17/16 0340  NA 139  K 3.5  CL 103  CO2 30  GLUCOSE 91  BUN 9  CREATININE 0.89  CALCIUM 8.3*     Liver Panel  No results for input(s): PROT, ALBUMIN, AST, ALT, ALKPHOS, BILITOT, BILIDIR, IBILI in the last 72 hours.     Sedimentation Rate  Recent Labs  01/18/16 0612  ESRSEDRATE 47*   C-Reactive Protein  Recent Labs  01/18/16 0612  CRP 11.1*    Micro Results: Recent Results (from the past 720 hour(s))  Aerobic/Anaerobic Culture (surgical/deep wound)  Status: None (Preliminary result)   Collection Time: 01/16/16  5:09 PM  Result Value Ref Range Status   Specimen Description ABSCESS RIGHT SHOULDER  Final   Special Requests NONE  Final   Gram Stain   Final    ABUNDANT WBC PRESENT,BOTH PMN AND MONONUCLEAR NO ORGANISMS SEEN    Culture   Final    NO GROWTH 2 DAYS NO ANAEROBES ISOLATED; CULTURE IN PROGRESS FOR 5 DAYS   Report Status PENDING  Incomplete    Studies/Results: No results found.    Assessment/Plan  INTERVAL HISTORY: Cultures no growth to date 2 days from the office   Active Problems:   Post-operative infection   Pyogenic arthritis of right shoulder region [facility])   Tattoo   Routine screening for STI (sexually transmitted infection)   Post op  infection    Ryan Guerra is a 25 y.o. male with  Infection after placement of wound graft with screws status post I&D in the operating room with infection by definition extending into the joint space.  #1 Postoperative infection with septic joint:  Continue IV vancomycin.  I'm adding IV ceftriaxone to cover for streptococci that might be difficult to grow I think today along with coag negative staph are that most likely culprit signs were not growing any organisms.  Similarly do not recover an organism the following antibiotic plan will be the one we'll follow   Diagnosis: Septic shoulder  Culture Result: No growth  No Known Allergies  Discharge antibiotics:ceftriaxone 2 g IV daily and vancomycin dosed per pharmacy Per pharmacy protocol  Aim for Vancomycin trough 15-20 (unless otherwise indicated) Duration: 6 weeks End Date:  January 2nd, 2018  [facility] Care Per Protocol:  Biweekly labs while on IV antibodies: x --- BMP with GFR  Labs weekly while on IV antibiotics: _x_ CBC with differential _x_ CRP _x_ ESR _x_ Vancomycin trough  _x_ Please pull PIC at completion of IV antibiotics __ Please leave PIC in place until doctor has seen patient or been notified  Fax weekly labs to [phone]  Clinic Follow Up Appt:  Sometime in the next 3 weeks prior to the wonder break in our clinic  I'll arrange hospital follow-up    LOS: 1 day   [name] 01/18/2016, 7:22 PM

## 2016-01-18 NOTE — Discharge Instructions (Signed)

## 2016-01-18 NOTE — Progress Notes (Addendum)
   PATIENT ID: Ryan Guerra   2 Days Post-Op Procedure(s) (LRB): IRRIGATION AND DEBRIDEMENT SHOULDER (Right)  Subjective: Up sitting on side of bed eating breakfast. Denies fever, chills and sx of infection. Continued shoulder pain controlled with pain rx.   Objective:  Vitals:   01/17/16 2018 01/18/16 0523  BP: 131/71 120/65  Pulse: 91 74  Resp: 18 16  Temp: 98.8 F (37.1 C) 98.8 F (37.1 C)     R shoulder dressing C/D/I NVID Drains intact and draining seropurulent  Labs:   Recent Labs  01/16/16 1559 01/17/16 0340  HGB 15.4 12.2*   Recent Labs  01/16/16 1559 01/17/16 0340  WBC 11.3* 8.3  RBC 5.06 4.19*  HCT 42.7 35.9*  PLT 328 288   Recent Labs  01/17/16 0340  NA 139  K 3.5  CL 103  CO2 30  BUN 9  CREATININE 0.89  GLUCOSE 91  CALCIUM 8.3*    Assessment and Plan: POD2 s/p I&D R shoulder infection Cultures still pending x 2 days, no growth- Cont vanc for now per ID, awaiting cultures to speciate.  Likely will need PICC appreciate infectious disease help in planning care for this patient Continue drains Plan to d/c home with IV abx via PICC asap, waiting for cultures to move forward with plan  VTE proph: asa, scds

## 2016-01-19 LAB — HEPATITIS C ANTIBODY (REFLEX): HCV Ab: <0.1 s/co ratio (ref 0.0–0.9)

## 2016-01-19 LAB — VANCOMYCIN, TROUGH: VANCOMYCIN TR: 9 ug/mL — AB (ref 15–20)

## 2016-01-19 LAB — HCV COMMENT:

## 2016-01-19 LAB — HEPATITIS B SURFACE ANTIGEN: Hepatitis B Surface Ag: NEGATIVE

## 2016-01-19 MED ORDER — HEPARIN SOD (PORK) LOCK FLUSH 100 UNIT/ML IV SOLN
250.0000 [IU] | INTRAVENOUS | Status: AC | PRN
  Administered 2016-01-19: 250 [IU]

## 2016-01-19 MED ORDER — VANCOMYCIN IV (FOR PTA / DISCHARGE USE ONLY): 1250.0000 mg | Freq: Three times a day (TID) | INTRAVENOUS | 0 refills | Status: DC

## 2016-01-19 MED ORDER — CEFTRIAXONE IV (FOR PTA / DISCHARGE USE ONLY): 2.0000 g | INTRAVENOUS | 0 refills | Status: DC

## 2016-01-19 MED ORDER — SODIUM CHLORIDE 0.9% FLUSH
10.0000 mL | INTRAVENOUS | Status: DC | PRN
  Administered 2016-01-19: 10 mL
  Filled 2016-01-19: qty 40

## 2016-01-19 MED ORDER — VANCOMYCIN HCL 10 G IV SOLR
1250.0000 mg | Freq: Three times a day (TID) | INTRAVENOUS | Status: DC
  Administered 2016-01-19: 1250 mg via INTRAVENOUS
  Filled 2016-01-19 (×3): qty 1250

## 2016-01-19 NOTE — Care Management Note (Addendum)
Case Management Note  Patient Details  Name: Raliegh IpChristian B Wages MRN: [id number] Date of Birth: 1990-05-21  Subjective/Objective:                  IRRIGATION AND DEBRIDEMENT SHOULDER (Right)  Action/Plan: CM [facility]. CM called Jermaine with [facility] to advise and referral accepted. [facility] Confirmation received. RN spoke to MD who said that IV antibiotics will start in the AM on 01/20/16. Patient denies any further DME or HH needs at this time.   Expected Discharge Date:  01/19/16               Expected Discharge Plan:  Home w Home Health Services  In-House Referral:     Discharge planning Services  CM Consult  Post Acute Care Choice:  Durable Medical Equipment, Home Health Choice offered to:  Patient  DME Arranged:  IV pump/equipment DME Agency:  [facility]  HH Arr[facility]  [facility]  Status of Service:  Completed, signed off  If discussed at Long Length of Stay Meetings, dates discussed:    Additional Comments:  Darcel SmallingAnna C Romari Gasparro, RN 01/19/2016, 4:45 PM

## 2016-01-19 NOTE — Progress Notes (Signed)
Subjective: No new complaints   Antibiotics:  Anti-infectives    Start     Dose/Rate Route Frequency Ordered Stop   01/19/16 1600  vancomycin (VANCOCIN) 1,250 mg in sodium chloride 0.9 % 250 mL IVPB     1,250 mg 166.7 mL/hr over 90 Minutes Intravenous Every 8 hours 01/19/16 0936     01/18/16 1615  cefTRIAXone (ROCEPHIN) 2 g in dextrose 5 % 50 mL IVPB     2 g 100 mL/hr over 30 Minutes Intravenous Every 24 hours 01/18/16 1610     01/17/16 0800  vancomycin (VANCOCIN) 1,500 mg in sodium chloride 0.9 % 500 mL IVPB  Status:  Discontinued     1,500 mg 250 mL/hr over 120 Minutes Intravenous Every 12 hours 01/16/16 1921 01/19/16 0935   01/16/16 2000  vancomycin (VANCOCIN) IVPB 750 mg/150 ml premix     750 mg 150 mL/hr over 60 Minutes Intravenous  Once 01/16/16 1921 01/16/16 2137   01/16/16 1631  vancomycin (VANCOCIN) 1-5 GM/200ML-% IVPB    Comments:  Ryan Guerra   : cabinet override      01/16/16 1631 01/17/16 0444      Medications: Scheduled Meds: . cefTRIAXone (ROCEPHIN)  IV  2 g Intravenous Q24H  . docusate sodium  100 mg Oral BID  . vancomycin  1,250 mg Intravenous Q8H   Continuous Infusions: . sodium chloride 100 mL/hr at 01/18/16 1727   PRN Meds:.acetaminophen, aluminum hydroxide, bisacodyl, diphenhydrAMINE, menthol-cetylpyridinium **OR** phenol, metoCLOPramide **OR** metoCLOPramide (REGLAN) injection, morphine injection, ondansetron **OR** ondansetron (ZOFRAN) IV, oxyCODONE, polyethylene glycol, sodium chloride flush, sodium phosphate, zolpidem    Objective: Weight change:   Intake/Output Summary (Last 24 hours) at 01/19/16 1202 Last data filed at 01/19/16 0917  Gross per 24 hour  Intake             1380 ml  Output              110 ml  Net             1270 ml   Blood pressure 111/61, pulse 79, temperature 98.2 F (36.8 C), resp. rate 17, height _0  (2.007 m), weight 200 lb (90.7 kg), SpO2 98 %. Temp:  [98.1 F (36.7 C)-98.3 F (36.8 C)] 98.2 F  (36.8 C) (11/25 0635) Pulse Rate:  [70-80] 79 (11/25 0635) Resp:  [16-17] 17 (11/25 0635) BP: (111-121)/(61-78) 111/61 (11/25 0635) SpO2:  [98 %-100 %] 98 % (11/25 9292)  Physical Exam:  General: Alert and awake, oriented x3, not in any acute distress. HEENT: anicteric sclera,  EOMI, oropharynx clear and without exudate Cardiovascular: regular rate, normal r,   Pulmonary:  no wheezing, respiratory distress Gastrointestinal: soft nontender, nondistended,  Musculoskeletal: Right arm and sling and shoulder with pads in place Skin, soft tissue: no rashes +tattoos Neuro: nonfocal, strength and sensation intact   CBC:  CBC Latest Ref Rng & Units 01/17/2016 01/16/2016 11/18/2014  WBC 4.0 - 10.5 K/uL 8.3 11.3(H) 20.2(H)  Hemoglobin 13.0 - 17.0 g/dL 12.2(L) 15.4 15.2  Hematocrit 39.0 - 52.0 % 35.9(L) 42.7 43.4  Platelets 150 - 400 K/uL 288 328 187     BMET  Recent Labs  01/17/16 0340  NA 139  K 3.5  CL 103  CO2 30  GLUCOSE 91  BUN 9  CREATININE 0.89  CALCIUM 8.3*     Liver Panel  No results for input(s): PROT, ALBUMIN, AST, ALT, ALKPHOS, BILITOT, BILIDIR, IBILI in the last 72 hours.  Sedimentation Rate  Recent Labs  01/18/16 0612  ESRSEDRATE 47*   C-Reactive Protein  Recent Labs  01/18/16 0612  CRP 11.1*    Micro Results: Recent Results (from the past 720 hour(s))  Aerobic/Anaerobic Culture (surgical/deep wound)     Status: None (Preliminary result)   Collection Time: 01/16/16  5:09 PM  Result Value Ref Range Status   Specimen Description ABSCESS RIGHT SHOULDER  Final   Special Requests NONE  Final   Gram Stain   Final    ABUNDANT WBC PRESENT,BOTH PMN AND MONONUCLEAR NO ORGANISMS SEEN    Culture   Final    NO GROWTH 2 DAYS NO ANAEROBES ISOLATED; CULTURE IN PROGRESS FOR 5 DAYS   Report Status PENDING  Incomplete    Studies/Results: No results found.    Assessment/Plan  INTERVAL HISTORY: Cultures no growth to date x3  days from the  office FINAL   Active Problems:   Post-operative infection   Pyogenic arthritis of right shoulder region [facility])   Tattoo   Routine screening for STI (sexually transmitted infection)   Post op infection    Ryan Guerra is a 25 y.o. male with  Infection after placement of wound graft with screws status post I&D in the operating room with infection by definition extending into the joint space.  #1 Postoperative infection with septic joint:  Continue IV vancomycin + ctx   Diagnosis: Septic shoulder  Culture Result: No growth  No Known Allergies  Discharge antibiotics:ceftriaxone 2 g IV daily and vancomycin dosed per pharmacy Per pharmacy protocol  Aim for Vancomycin trough 15-20 (unless otherwise indicated) Duration: 6 weeks End Date:  January 2nd, 2018  [facility] Care Per Protocol:  Biweekly labs while on IV antibodies: x --- BMP with GFR  Labs weekly while on IV antibiotics: _x_ CBC with differential _x_ CRP _x_ ESR _x_ Vancomycin trough  _x_ Please pull PIC at completion of IV antibiotics __ Please leave PIC in place until doctor has seen patient or been notified  Fax weekly labs to [phone]  Clinic Follow Up Appt:  Sometime in the next 3 weeks prior to the wonder break in our clinic  I'll arrange hospital follow-up    LOS: 2 days   [name] 01/19/2016, 12:02 PM

## 2016-01-19 NOTE — Progress Notes (Signed)
Peripherally Inserted Central Catheter/Midline Placement  The IV Nurse has discussed with the patient and/or persons authorized to consent for the patient, the purpose of this procedure and the potential benefits and risks involved with this procedure.  The benefits include less needle sticks, lab draws from the catheter, and the patient may be discharged home with the catheter. Risks include, but not limited to, infection, bleeding, blood clot (thrombus formation), and puncture of an artery; nerve damage and irregular heartbeat and possibility to perform a PICC exchange if needed/ordered by physician.  Alternatives to this procedure were also discussed.  B[employer] Power PICC patient education guide, fact sheet on infection prevention and patient information card has been provided to patient /or left at bedside.    PICC/Midline Placement Documentation        Ryan Guerra, Ryan Guerra 01/19/2016, 11:00 AM

## 2016-01-19 NOTE — Progress Notes (Signed)
Pharmacy Antibiotic Note  Ryan Guerra is a 25 y.o. male admitted on 01/16/2016 with septic joint. Pharmacy has been consulted for vancomycin dosing.  Day #4 of vancomycin for R shoulder post op infection. S/p I&D on 11/22. Does have some hardware with screws in his body. ID recommends 6 week course of vancomycin and ceftriaxone. VT this morning was low at 9. Looks like patient will need Q8 dosing of vancomycin. Afebrile, WBC down to wnl.  Plan: Increase vancomycin to 1.25g IV Q8 Continue ceftriaxone 2g IV Q24 Monitor clinical picture, renal function, VT at new Css (11/26)  Height: 6\' 7"  (200.7 cm) Weight: 200 lb (90.7 kg) IBW/kg (Calculated) : 93.7  Temp (24hrs), Avg:98.2 F (36.8 C), Min:98.1 F (36.7 C), Max:98.3 F (36.8 C)   Recent Labs Lab 01/16/16 1559 01/17/16 0340 01/19/16 0711  WBC 11.3* 8.3  --   CREATININE  --  0.89  --   VANCOTROUGH  --   --  9*    Estimated Creatinine Clearance: 164.2 mL/min (by C-G formula based on SCr of 0.89 mg/dL).    No Known Allergies  Antimicrobials this admission:  Vancomycin 11/22 >>  Dose adjustments this admission:  11/25 VT = 9 (On 1.5g Q12; increase dose to 1,250mg  Q8)  Microbiology results:  11/22 Deep Wound Cx: pending  Thank you for allowing pharmacy to be a part of this patient's care.  Enzo BiNathan Davison Ohms, PharmD, BCPS Clinical Pharmacist Pager [phone] 01/19/2016 9:39 AM

## 2016-01-19 NOTE — Progress Notes (Signed)
   PATIENT ID: Ryan Guerra   3 Days Post-Op Procedure(s) (LRB): IRRIGATION AND DEBRIDEMENT SHOULDER (Right)  Subjective: Doing well this am. Denies fever, chills or sx of infection. Continued right shoulder pain.   Objective:  Vitals:   01/18/16 2049 01/19/16 0635  BP: 121/78 111/61  Pulse: 70 79  Resp: 17 17  Temp: 98.3 F (36.8 C) 98.2 F (36.8 C)     R shoulder dressing changed today Incision benign, no surrounding erythema, warmth, no swelling or fullness NVID Drains pulled today  Labs:   Recent Labs  01/16/16 1559 01/17/16 0340  HGB 15.4 12.2*   Recent Labs  01/16/16 1559 01/17/16 0340  WBC 11.3* 8.3  RBC 5.06 4.19*  HCT 42.7 35.9*  PLT 328 288   Recent Labs  01/17/16 0340  NA 139  K 3.5  CL 103  CO2 30  BUN 9  CREATININE 0.89  GLUCOSE 91  CALCIUM 8.3*    Assessment and Plan: POD 3 s/p I&D R shoulder infection Cultures still pending x 2 days, no growth- ID added ceftriaxone, plan to d/c with vanc and ceftriaxone IV via PICC line Order to PICC line placement today appreciate infectious disease help in planning care for this patient Plan to d/c home today when plan finalized by ID and scripts added Script for percocet in chart Fu with Dr. Ave Filterhandler in 10 days unless worsening sx VTE proph: asa, scds

## 2016-01-21 LAB — AEROBIC/ANAEROBIC CULTURE W GRAM STAIN (SURGICAL/DEEP WOUND): Culture: NO GROWTH

## 2016-01-21 LAB — AEROBIC/ANAEROBIC CULTURE (SURGICAL/DEEP WOUND)

## 2016-01-21 NOTE — Anesthesia Postprocedure Evaluation (Signed)
Anesthesia Post Note  Patient: Ryan IpChristian B Adger  Procedure(s) Performed: Procedure(s) (LRB): IRRIGATION AND DEBRIDEMENT SHOULDER (Right)  Patient location during evaluation: PACU Anesthesia Type: General Level of consciousness: sedated and patient cooperative Pain management: pain level controlled Vital Signs Assessment: post-procedure vital signs reviewed and stable Respiratory status: spontaneous breathing Cardiovascular status: stable Anesthetic complications: no    Last Vitals:  Vitals:   01/19/16 0635 01/19/16 1410  BP: 111/61 121/61  Pulse: 79 71  Resp: 17 18  Temp: 36.8 C 37 C    Last Pain:  Vitals:   01/19/16 1527  TempSrc:   PainSc: 2                  Lewie LoronJohn Stina Gane

## 2016-01-21 NOTE — Discharge Summary (Signed)
Patient ID: RHYKER SILVERSMITH MRN: [id number] DOB/AGE: [birth year] 25 y.o.  Admit date: 01/16/2016 Discharge date: 01/19/2016  Admission Diagnoses:  Active Problems:   Post-operative infection   Pyogenic arthritis of right shoulder region [facility])   Tattoo   Routine screening for STI (sexually transmitted infection)   Post op infection   Discharge Diagnoses:  Same  Past Medical History:  Diagnosis Date  . Bipolar disorder ([place])   . GERD (gastroesophageal reflux disease)    no current med.  Sallee Provencal nose 12/25/2015   clear drainage, per pt.  . 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 Sore throat 12/25/2015    Surgeries: Procedure(s): IRRIGATION AND DEBRIDEMENT SHOULDER on 01/16/2016   Consultants:   Discharged Condition: Improved  Hospital Course: BAYDEN GIL is an 25 y.o. male who was admitted 01/16/2016 for operative treatment of post operative infection 2 weeks s/p right shoulder open coracoid transfer. Patient had increasing pain and swelling in the joint with surround erythema consistant with infection. After pre-op clearance the patient was taken to the operating room on 01/16/2016 and underwent  Procedure(s): IRRIGATION AND DEBRIDEMENT SHOULDER.    Patient was given perioperative antibiotics:  Anti-infectives    Start     Dose/Rate Route Frequency Ordered Stop   01/19/16 1600  vancomycin (VANCOCIN) 1,250 mg in sodium chloride 0.9 % 250 mL IVPB  Status:  Discontinued     1,250 mg 166.7 mL/hr over 90 Minutes Intravenous Every 8 hours 01/19/16 0936 01/19/16 2254   01/19/16 0000  vancomycin IVPB     1,250 mg Intravenous Every 8 hours 01/19/16 1554 02/28/16 2359   01/19/16 0000  cefTRIAXone (ROCEPHIN) IVPB     2 g Intravenous Every 24 hours 01/19/16 1554 02/28/16 2359   01/18/16 1615  cefTRIAXone (ROCEPHIN) 2 g in dextrose 5 % 50 mL IVPB  Status:  Discontinued     2 g 100 mL/hr over 30 Minutes Intravenous Every 24 hours 01/18/16 1610 01/19/16 2254   01/17/16  0800  vancomycin (VANCOCIN) 1,500 mg in sodium chloride 0.9 % 500 mL IVPB  Status:  Discontinued     1,500 mg 250 mL/hr over 120 Minutes Intravenous Every 12 hours 01/16/16 1921 01/19/16 0935   01/16/16 2000  vancomycin (VANCOCIN) IVPB 750 mg/150 ml premix     750 mg 150 mL/hr over 60 Minutes Intravenous  Once 01/16/16 1921 01/16/16 2137   01/16/16 1631  vancomycin (VANCOCIN) 1-5 GM/200ML-% IVPB    Comments:  Laurita Quint   : cabinet override      01/16/16 1631 01/17/16 0444       Patient was given sequential compression devices, early ambulation, and asa to prevent DVT.  Patient benefited maximally from hospital stay and there were no complications.  Infectious disease was consulted for abx plan, d/c with 6 weeks IV abx and fu with ID per plan.   Recent vital signs: No data found.    Recent laboratory studies: No results for input(s): WBC, HGB, HCT, PLT, NA, K, CL, CO2, BUN, CREATININE, GLUCOSE, INR, CALCIUM in the last 72 hours.  Invalid input(s): PT, 2   Discharge Medications:     Medication List    STOP taking these medications   ibuprofen 200 MG tablet Commonly known as:  ADVIL,MOTRIN     TAKE these medications   aspirin EC 325 MG tablet Take 325 mg by mouth daily as needed (pain).   cefTRIAXone IVPB Commonly known as:  ROCEPHIN Inject 2 g into the vein daily. Indication:  Septic joint Last Day of Therapy: 02/28/16 Labs - Once weekly:  CBC/D and BMP, Labs - Every other week:  ESR and CRP   oxyCODONE-acetaminophen 5-325 MG tablet Commonly known as:  PERCOCET/ROXICET Take 1-2 tablets by mouth every 4 (four) hours as needed for severe pain. What changed:  how much to take   vancomycin IVPB Inject 1,250 mg into the vein every 8 (eight) hours. Indication:  Septic joint  Last Day of Therapy: 02/28/16 Labs - _0 /25/17 1554      Diagnostic Studies: Dg Shoulder 1v Right  Result Date: 12/31/2015 CLINICAL DATA:  Shoulder blade pain EXAM: DG C-ARM 61-120 MIN; RIGHT SHOULDER - 1 VIEW COMPARISON:  None. FINDINGS: The patient is status post avulsion fracture of the anterior glenoid repair. Two metallic fixation screws are noted in inferior aspect of the scapular glenoid. There is anatomic alignment. IMPRESSION: The patient is status post avulsion fracture of the anterior glenoid repair. Two metallic fixation screws are noted in inferior aspect of the scapular glenoid. There is anatomic alignment. Fluoroscopy time 3 seconds.  Please see the operative report. Electronically Signed   By: Lahoma Crocker M.D.   On: 12/31/2015 16:49   Dg C-arm 1-60 Min  Result Date: 12/31/2015 CLINICAL DATA:  Shoulder blade pain EXAM: DG C-ARM 61-120 MIN; RIGHT SHOULDER - 1 VIEW  COMPARISON:  None. FINDINGS: The patient is status post avulsion fracture of the anterior glenoid repair. Two metallic fixation screws are noted in inferior aspect of the scapular glenoid. There is anatomic alignment. IMPRESSION: The patient is status post avulsion fracture of the anterior glenoid repair. Two metallic fixation screws are noted in inferior aspect of the scapular glenoid. There is anatomic alignment. Fluoroscopy time 3 seconds.  Please see the operative report. Electronically Signed   By: Lahoma Crocker M.D.   On: 12/31/2015 16:49    Disposition: 01-Home or Self Care  Discharge Instructions    Call MD / Call 911    Complete by:  As directed    If you experience chest pain or shortness of breath, CALL 911 and be transported to the hospital emergency room.  If you develope a fever above 101 F, pus (white drainage)  or increased drainage or redness at the wound, or calf pain, call your surgeon's office.   Constipation Prevention    Complete by:  As directed    Drink plenty of fluids.  Prune juice may be helpful.  You may use a stool softener, such as Colace (over the counter) 100 mg twice a day.  Use MiraLax (over the counter) for constipation as needed.   Diet - low sodium heart healthy    Complete by:  As directed    Home infusion instructions [facility] May follow [facility] Dosing Protocol; May administer Cathflo as needed to maintain patency of vascular access device.; Flushing of vascular access device: per [facility] Protocol: 0.9% NaCl pre/post medica...    Complete by:  As directed    Instructions:  May follow [place] Dosing Protocol   Instructions:  May administer Cathflo as needed to maintain patency of vascular access device.   Instructions:  Flushing of vascular access device: per [facility] Protocol: 0.9% NaCl pre/post medication administration and prn patency; Heparin 100 u/ml, 24m for implanted ports and Heparin 10u/ml, 569mfor all other central venous catheters.    Instructions:  May follow [facility] Anaphylaxis Protocol for First Dose Administration in the home: 0.9% NaCl at 25-50 ml/hr to maintain IV access for protocol meds. Epinephrine 0.3 ml IV/IM PRN and Benadryl 25-50 IV/IM PRN s/s of anaphylaxis.   Instructions:  [facility] Coordinator (RN) to assist per patient IV care needs in the home PRN.   Increase activity slowly as tolerated    Complete by:  As directed       Follow-up Information    CHNita SellsMD. Schedule an appointment as soon as possible for a visit in 10 day(s).   Specialty:  Orthopedic Surgery Contact information: [phone]        [facility] up.   Why:  Home health for IV antibiotics; nurse aide and registered nurse.  Contact information: [phone]            Signed: LAGrier Mitts1/27/2017, 1:06 PM

## 2016-01-22 ENCOUNTER — Telehealth: Payer: Self-pay | Admitting: Infectious Disease

## 2016-01-22 ENCOUNTER — Telehealth: Payer: Self-pay | Admitting: Pharmacist

## 2016-01-22 NOTE — Telephone Encounter (Signed)
Perfect yes both vancomycin and cefepime

## 2016-01-22 NOTE — Telephone Encounter (Signed)
I was called by Orthopedist, Dr. Ave Filterhandler who saw the patient today. Apparently his shoulders swelled up again despite being on vancomycin and ceftriaxone. Dr. Ave Filterhandler drew fluid in the shoulder sent for bacterial cultures fungal and AFB cultures.  I would like for the patient to be changed from ceftriaxone to cefepime 2 g IV every 8 hours via [facility] and someone called him and make the changed from ceftriaxone to cefepime

## 2016-01-22 NOTE — Telephone Encounter (Signed)
Will route to pharmacy.  Continue vancomycin with cefepime? Andree CossHowell, Alejandro Adcox M, RN

## 2016-01-22 NOTE — Telephone Encounter (Signed)
Called [facility] and spoke with Amy, PharmD. Changed patient's antibiotics from Vanc + Ceftriaxone to Vanc + Cefepime 2 gm IV q8h per Dr. Daiva EvesVan Dam. He has a f/u appointment with Dr. Orvan Falconerampbell on 12/19.

## 2016-01-25 ENCOUNTER — Inpatient Hospital Stay (HOSPITAL_COMMUNITY)
Admission: EM | Admit: 2016-01-25 | Discharge: 2016-01-31 | DRG: 872 | Disposition: A | Discharge status: Home Health | Payer: Medicaid Other | Attending: Internal Medicine | Admitting: Internal Medicine

## 2016-01-25 ENCOUNTER — Encounter (HOSPITAL_COMMUNITY): Payer: Self-pay | Admitting: Emergency Medicine

## 2016-01-25 ENCOUNTER — Emergency Department (HOSPITAL_COMMUNITY): Payer: Medicaid Other

## 2016-01-25 DIAGNOSIS — T50905A Adverse effect of unspecified drugs, medicaments and biological substances, initial encounter: Secondary | ICD-10-CM | POA: Diagnosis present

## 2016-01-25 DIAGNOSIS — F17210 Nicotine dependence, cigarettes, uncomplicated: Secondary | ICD-10-CM | POA: Diagnosis present

## 2016-01-25 DIAGNOSIS — Z720 Tobacco use: Secondary | ICD-10-CM | POA: Diagnosis present

## 2016-01-25 DIAGNOSIS — Z79899 Other long term (current) drug therapy: Secondary | ICD-10-CM

## 2016-01-25 DIAGNOSIS — R21 Rash and other nonspecific skin eruption: Secondary | ICD-10-CM | POA: Diagnosis present

## 2016-01-25 DIAGNOSIS — T361X5A Adverse effect of cephalosporins and other beta-lactam antibiotics, initial encounter: Secondary | ICD-10-CM | POA: Diagnosis present

## 2016-01-25 DIAGNOSIS — F319 Bipolar disorder, unspecified: Secondary | ICD-10-CM | POA: Diagnosis present

## 2016-01-25 DIAGNOSIS — K219 Gastro-esophageal reflux disease without esophagitis: Secondary | ICD-10-CM | POA: Diagnosis present

## 2016-01-25 DIAGNOSIS — R6520 Severe sepsis without septic shock: Secondary | ICD-10-CM | POA: Diagnosis present

## 2016-01-25 DIAGNOSIS — Z79891 Long term (current) use of opiate analgesic: Secondary | ICD-10-CM

## 2016-01-25 DIAGNOSIS — A419 Sepsis, unspecified organism: Principal | ICD-10-CM | POA: Diagnosis present

## 2016-01-25 DIAGNOSIS — R509 Fever, unspecified: Secondary | ICD-10-CM

## 2016-01-25 DIAGNOSIS — G8918 Other acute postprocedural pain: Secondary | ICD-10-CM

## 2016-01-25 DIAGNOSIS — T368X5A Adverse effect of other systemic antibiotics, initial encounter: Secondary | ICD-10-CM | POA: Diagnosis present

## 2016-01-25 DIAGNOSIS — R502 Drug induced fever: Secondary | ICD-10-CM | POA: Diagnosis present

## 2016-01-25 DIAGNOSIS — M009 Pyogenic arthritis, unspecified: Secondary | ICD-10-CM | POA: Diagnosis present

## 2016-01-25 DIAGNOSIS — L270 Generalized skin eruption due to drugs and medicaments taken internally: Secondary | ICD-10-CM | POA: Diagnosis present

## 2016-01-25 LAB — COMPREHENSIVE METABOLIC PANEL
ALBUMIN: 3.1 g/dL — AB (ref 3.5–5.0)
ALK PHOS: 76 U/L (ref 38–126)
ALT: 55 U/L (ref 17–63)
ANION GAP: 13 (ref 5–15)
AST: 45 U/L — AB (ref 15–41)
BUN: 8 mg/dL (ref 6–20)
CALCIUM: 8.6 mg/dL — AB (ref 8.9–10.3)
CO2: 24 mmol/L (ref 22–32)
Chloride: 98 mmol/L — ABNORMAL LOW (ref 101–111)
Creatinine, Ser: 1.09 mg/dL (ref 0.61–1.24)
GFR calc Af Amer: >60 mL/min (ref >60)
GFR calc non Af Amer: >60 mL/min (ref >60)
GLUCOSE: 109 mg/dL — AB (ref 65–99)
Potassium: 3.5 mmol/L (ref 3.5–5.1)
SODIUM: 135 mmol/L (ref 135–145)
Total Bilirubin: 0.5 mg/dL (ref 0.3–1.2)
Total Protein: 6.7 g/dL (ref 6.5–8.1)

## 2016-01-25 LAB — CBC WITH DIFFERENTIAL/PLATELET
Basophils Absolute: 0 10*3/uL (ref 0.0–0.1)
Basophils Relative: 0 %
Eosinophils Absolute: 0.1 10*3/uL (ref 0.0–0.7)
Eosinophils Relative: 2 %
HCT: 36.3 % — ABNORMAL LOW (ref 39.0–52.0)
HEMOGLOBIN: 12.6 g/dL — AB (ref 13.0–17.0)
LYMPHS ABS: 0.9 10*3/uL (ref 0.7–4.0)
LYMPHS PCT: 16 %
MCH: 28.8 pg (ref 26.0–34.0)
MCHC: 34.7 g/dL (ref 30.0–36.0)
MCV: 83.1 fL (ref 78.0–100.0)
MONOS PCT: 5 %
Monocytes Absolute: 0.3 10*3/uL (ref 0.1–1.0)
NEUTROS PCT: 77 %
Neutro Abs: 4.2 10*3/uL (ref 1.7–7.7)
Platelets: 257 10*3/uL (ref 150–400)
RBC: 4.37 MIL/uL (ref 4.22–5.81)
RDW: 11.4 % — ABNORMAL LOW (ref 11.5–15.5)
WBC: 5.4 10*3/uL (ref 4.0–10.5)

## 2016-01-25 LAB — PROTIME-INR
INR: 1.25
Prothrombin Time: 15.8 seconds — ABNORMAL HIGH (ref 11.4–15.2)

## 2016-01-25 LAB — I-STAT CG4 LACTIC ACID, ED
Lactic Acid, Venous: 0.52 mmol/L (ref 0.5–1.9)
Lactic Acid, Venous: 2.06 mmol/L (ref 0.5–1.9)

## 2016-01-25 MED ORDER — VANCOMYCIN HCL 10 G IV SOLR
1250.0000 mg | Freq: Once | INTRAVENOUS | Status: AC
  Administered 2016-01-25: 1250 mg via INTRAVENOUS
  Filled 2016-01-25 (×2): qty 1250

## 2016-01-25 MED ORDER — DEXTROSE 5 % IV SOLN
2.0000 g | Freq: Once | INTRAVENOUS | Status: AC
  Administered 2016-01-25: 2 g via INTRAVENOUS
  Filled 2016-01-25: qty 2

## 2016-01-25 MED ORDER — ACETAMINOPHEN 500 MG PO TABS
1000.0000 mg | ORAL_TABLET | Freq: Once | ORAL | Status: AC
  Administered 2016-01-25: 1000 mg via ORAL
  Filled 2016-01-25: qty 2

## 2016-01-25 MED ORDER — SODIUM CHLORIDE 0.9 % IV BOLUS (SEPSIS)
1000.0000 mL | Freq: Once | INTRAVENOUS | Status: AC
  Administered 2016-01-25: 1000 mL via INTRAVENOUS

## 2016-01-25 MED ORDER — OXYCODONE HCL 5 MG PO TABS
5.0000 mg | ORAL_TABLET | Freq: Once | ORAL | Status: AC
  Administered 2016-01-25: 5 mg via ORAL
  Filled 2016-01-25: qty 1

## 2016-01-25 NOTE — ED Notes (Signed)
Ryan BraunKaren Press photographercharge nurse notified of patients severe condition. Will move to next available room

## 2016-01-25 NOTE — ED Triage Notes (Signed)
Per pt, pt had shoulder surgery a month ago, had an infection in his shoulder and he has a PICC on his L arm that hes been getting vanc. Pt has red rash all over R shoulder. Pt is febrile and tachycardic.

## 2016-01-25 NOTE — ED Provider Notes (Signed)
[facility] DEPT Provider Note   CSN: [id number] Arrival date & time: 01/25/16  2021     History   Chief Complaint Chief Complaint  Patient presents with  . Post-op Problem    HPI Ryan Guerra is a 25 y.o. male.   Fever   This is a recurrent problem. The current episode started more than 2 days ago. The problem occurs constantly. The problem has not changed since onset.The maximum temperature noted was 101 to 101.9 F. The temperature was taken using an oral thermometer. Associated symptoms include headaches and muscle aches. Pertinent negatives include no chest pain, no vomiting, no sore throat and no cough. He has tried nothing for the symptoms. The treatment provided no relief.    Past Medical History:  Diagnosis Date  . Bipolar disorder ([place])   . GERD (gastroesophageal reflux disease)    no current med.  Sallee Provencal nose 12/25/2015   clear drainage, per pt.  . 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 Sore throat 12/25/2015    Patient Active Problem List   Diagnosis Date Noted  . Post op infection 01/17/2016  . 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 Tattoo   . Routine screening for STI (sexually transmitted infection)   . Post-operative infection 01/16/2016    Past Surgical History:  Procedure Laterality Date  . INCISION AND DRAINAGE Right 01/16/2016   shoulder  . IRRIGATION AND DEBRIDEMENT SHOULDER Right 01/16/2016   Procedure: IRRIGATION AND DEBRIDEMENT SHOULDER;  Surgeon: Tania Ade, MD;  Location: [facility];  Service: Orthopedics;  Laterality: Right;  Irrigation and debridement right shoulder infection  . SHOULDER LATERJET Right 12/31/2015   Procedure: OPEN CORACOID TRANSFER;  Surgeon: Tania Ade, MD;  Location: [facility];  Service: Orthopedics;  Laterality: Right;  . 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  Authorizing Provider  acetaminophen (TYLENOL) 325 MG tablet Take 650 mg by mouth every 6 (six) hours as needed for mild pain.   Yes Historical Provider, MD  cefTRIAXone (ROCEPHIN) IVPB Inject 2 g into the vein daily. Indication:  Septic joint Last Day of Therapy: 02/28/16 Labs - Once weekly:  CBC/D and BMP, Labs - Every other week:  ESR and CRP 01/19/16 02/28/16 Yes Danielle Laliberte, PA-C  oxyCODONE-acetaminophen (PERCOCET/ROXICET) 5-325 MG tablet Take 1-2 tablets by mouth every 4 (four) hours as needed for severe pain. 01/18/16  Yes Danielle Laliberte, PA-C  vancomycin IVPB Inject 1,250 mg into the vein every 8 (eight) hours. Indication:  Septic joint  Last Day of Therapy: 02/28/16 Labs - Sunday/Monday:  CBC/D, BMP, and vancomycin trough. Labs - Thursday:  BMP and vancomycin trough Labs - Every other week:  ESR and CRP 01/19/16 02/28/16 Yes Grier Mitts, PA-C    Family History No family history on file.  Social History Social History  Substance Use Topics  . Smoking status: Current Every Day Smoker    Packs/day: 0.25    Years: 8.00    Types: Cigarettes  . Smokeless tobacco: Former Systems developer  . Alcohol use 0.6 oz/week    1 Cans of beer per week     Allergies   Patient has no known allergies.   Review of Systems Review of Systems  Constitutional: Positive for fever. Negative for chills.  HENT: Negative for ear pain and sore throat.   Eyes: Negative  for pain and visual disturbance.  Respiratory: Negative for cough and shortness of breath.   Cardiovascular: Negative for chest pain and palpitations.  Gastrointestinal: Negative for abdominal pain and vomiting.  Genitourinary: Negative for dysuria and hematuria.  Musculoskeletal: Positive for myalgias. Negative for arthralgias and back pain.  Skin: Positive for rash. Negative for color change.  Neurological: Positive for headaches. Negative for seizures and syncope.  All other systems reviewed and are negative.    Physical  Exam Updated Vital Signs BP 117/69   Pulse 87   Temp 102.2 F (39 C) (Oral)   Resp (!) 27   SpO2 100%   Physical Exam  Constitutional: He is oriented to person, place, and time. He appears well-developed and well-nourished.  HENT:  Head: Normocephalic and atraumatic.  Eyes: Conjunctivae are normal.  Neck: Neck supple.  Cardiovascular: Regular rhythm and normal heart sounds.  Tachycardia present.   No murmur heard. Pulmonary/Chest: Effort normal and breath sounds normal. No respiratory distress.  Abdominal: Soft. There is no tenderness.  Musculoskeletal: He exhibits no edema.  Neurological: He is alert and oriented to person, place, and time. No cranial nerve deficit or sensory deficit. He exhibits normal muscle tone. Coordination normal.  Skin: Skin is warm and dry. Rash noted. Rash is macular.  Diffuse patchy macular rash that blanches no weeping no pustules no vesicles.  Psychiatric: He has a normal mood and affect.  Nursing note and vitals reviewed.    ED Treatments / Results  Labs (all labs ordered are listed, but only abnormal results are displayed) Labs Reviewed  COMPREHENSIVE METABOLIC PANEL - Abnormal; Notable for the following:       Result Value   Chloride 98 (*)    Glucose, Bld 109 (*)    Calcium 8.6 (*)    Albumin 3.1 (*)    AST 45 (*)    All other components within normal limits  CBC WITH DIFFERENTIAL/PLATELET - Abnormal; Notable for the following:    Hemoglobin 12.6 (*)    HCT 36.3 (*)    RDW 11.4 (*)    All other components within normal limits  PROTIME-INR - Abnormal; Notable for the following:    Prothrombin Time 15.8 (*)    All other components within normal limits  I-STAT CG4 LACTIC ACID, ED - Abnormal; Notable for the following:    Lactic Acid, Venous 2.06 (*)    All other components within normal limits  CULTURE, BLOOD (ROUTINE X 2)  CULTURE, BLOOD (ROUTINE X 2)  URINE CULTURE  URINALYSIS, ROUTINE W REFLEX MICROSCOPIC (NOT AT [facility])  I-STAT  CG4 LACTIC ACID, ED    EKG  EKG Interpretation None       Radiology Dg Chest 2 View  Result Date: 01/25/2016 CLINICAL DATA:  Acute onset of fever and tachycardia. Generalized weakness. Red rash over the right shoulder. Initial encounter. EXAM: CHEST  2 VIEW COMPARISON:  Chest radiograph performed 09/25/2007 FINDINGS: A left PICC is noted ending overlying the mid SVC. The lungs are well-aerated. Pulmonary vascularity is at the upper limits of normal. There is no evidence of focal opacification, pleural effusion or pneumothorax. The heart is normal in size; the mediastinal contour is within normal limits. No acute osseous abnormalities are seen. Postoperative change is noted at the right glenoid, with mild chronic deformity. IMPRESSION: No acute cardiopulmonary process seen. Electronically Signed   By: Garald Balding M.D.   On: 01/25/2016 22:29    Procedures Procedures (including critical care time)  Medications Ordered in  ED Medications  vancomycin (VANCOCIN) 1,250 mg in sodium chloride 0.9 % 250 mL IVPB (1,250 mg Intravenous New Bag/Given 01/25/16 2329)  sodium chloride 0.9 % bolus 1,000 mL (1,000 mLs Intravenous New Bag/Given 01/25/16 2141)  acetaminophen (TYLENOL) tablet 1,000 mg (1,000 mg Oral Given 01/25/16 2135)  oxyCODONE (Oxy IR/ROXICODONE) immediate release tablet 5 mg (5 mg Oral Given 01/25/16 2136)  ceFEPIme (MAXIPIME) 2 g in dextrose 5 % 50 mL IVPB (0 g Intravenous Stopped 01/25/16 2320)     Initial Impression / Assessment and Plan / ED Course  I have reviewed the triage vital signs and the nursing notes.  Pertinent labs & imaging results that were available during my care of the patient were reviewed by me and considered in my medical decision making (see chart for details).  Clinical Course     25 year old male recent shoulder surgery for infected joint with a home PICC line on thank and cefepime comes with fever tachycardia myalgias and rash. Been going on since  discharge 5 days ago but getting worse and worse. Been taking his medications. There is report in the medical record of IV drug abuse the patient denies this time. He has been taking his antibiotics. Initial lactic acid 2.06. He is a diffuse macular rash on torso and proximal extremities. As described above. Lungs sounds are clear chest x-ray no acute cardio pulmonary findings. Tylenol 1 L fluids given. His lactic acid clears and his tachycardia resolves patient is feeling better and rash is improving. He is full body aches mild headache. Presentation is that of possible flulike illness however with his other medical comorbidities patient needs admission for further management. Vital signs are stable time handoff care.  Final Clinical Impressions(s) / ED Diagnoses   Final diagnoses:  Post-operative pain  Fever, unspecified fever cause    New Prescriptions New Prescriptions   No medications on file     Dewaine Conger, MD 01/26/16 0045    Quintella Reichert, MD 01/29/16 [phone]

## 2016-01-25 NOTE — ED Notes (Signed)
Dr. Madilyn Hookees notified of patients condition.

## 2016-01-25 NOTE — ED Notes (Signed)
Patient transported to X-ray 

## 2016-01-25 NOTE — ED Notes (Signed)
Pt had R shoulder surgery and said it was infected pt stayed and got the infection cleared out but pt did not feel well when he was discharged. Pt has flu like symptoms and denies pain in the shoulder, pt has back pain and neck pain. Pt has new rash that occurred today. Pain in both hips

## 2016-01-26 ENCOUNTER — Inpatient Hospital Stay (HOSPITAL_COMMUNITY): Payer: Medicaid Other

## 2016-01-26 DIAGNOSIS — T368X5A Adverse effect of other systemic antibiotics, initial encounter: Secondary | ICD-10-CM | POA: Diagnosis present

## 2016-01-26 DIAGNOSIS — M00811 Arthritis due to other bacteria, right shoulder: Secondary | ICD-10-CM | POA: Diagnosis not present

## 2016-01-26 DIAGNOSIS — F17210 Nicotine dependence, cigarettes, uncomplicated: Secondary | ICD-10-CM | POA: Diagnosis present

## 2016-01-26 DIAGNOSIS — R509 Fever, unspecified: Secondary | ICD-10-CM

## 2016-01-26 DIAGNOSIS — Z95828 Presence of other vascular implants and grafts: Secondary | ICD-10-CM

## 2016-01-26 DIAGNOSIS — A419 Sepsis, unspecified organism: Secondary | ICD-10-CM | POA: Diagnosis present

## 2016-01-26 DIAGNOSIS — Z96698 Presence of other orthopedic joint implants: Secondary | ICD-10-CM

## 2016-01-26 DIAGNOSIS — K219 Gastro-esophageal reflux disease without esophagitis: Secondary | ICD-10-CM | POA: Diagnosis present

## 2016-01-26 DIAGNOSIS — Z79899 Other long term (current) drug therapy: Secondary | ICD-10-CM | POA: Diagnosis not present

## 2016-01-26 DIAGNOSIS — R502 Drug induced fever: Secondary | ICD-10-CM | POA: Diagnosis present

## 2016-01-26 DIAGNOSIS — I339 Acute and subacute endocarditis, unspecified: Secondary | ICD-10-CM | POA: Diagnosis not present

## 2016-01-26 DIAGNOSIS — Z720 Tobacco use: Secondary | ICD-10-CM | POA: Diagnosis not present

## 2016-01-26 DIAGNOSIS — R21 Rash and other nonspecific skin eruption: Secondary | ICD-10-CM

## 2016-01-26 DIAGNOSIS — M009 Pyogenic arthritis, unspecified: Secondary | ICD-10-CM | POA: Diagnosis not present

## 2016-01-26 DIAGNOSIS — G8918 Other acute postprocedural pain: Secondary | ICD-10-CM | POA: Diagnosis present

## 2016-01-26 DIAGNOSIS — T50905A Adverse effect of unspecified drugs, medicaments and biological substances, initial encounter: Secondary | ICD-10-CM

## 2016-01-26 DIAGNOSIS — L270 Generalized skin eruption due to drugs and medicaments taken internally: Secondary | ICD-10-CM

## 2016-01-26 DIAGNOSIS — F319 Bipolar disorder, unspecified: Secondary | ICD-10-CM | POA: Diagnosis present

## 2016-01-26 DIAGNOSIS — Z79891 Long term (current) use of opiate analgesic: Secondary | ICD-10-CM | POA: Diagnosis not present

## 2016-01-26 DIAGNOSIS — B9689 Other specified bacterial agents as the cause of diseases classified elsewhere: Secondary | ICD-10-CM | POA: Diagnosis not present

## 2016-01-26 DIAGNOSIS — R6520 Severe sepsis without septic shock: Secondary | ICD-10-CM | POA: Diagnosis present

## 2016-01-26 DIAGNOSIS — T361X5A Adverse effect of cephalosporins and other beta-lactam antibiotics, initial encounter: Secondary | ICD-10-CM | POA: Diagnosis present

## 2016-01-26 LAB — RAPID URINE DRUG SCREEN, HOSP PERFORMED
AMPHETAMINES: NOT DETECTED
Amphetamines: NOT DETECTED
BARBITURATES: NOT DETECTED
BENZODIAZEPINES: NOT DETECTED
Barbiturates: NOT DETECTED
Benzodiazepines: NOT DETECTED
Cocaine: NOT DETECTED
Cocaine: NOT DETECTED
OPIATES: NOT DETECTED
Opiates: NOT DETECTED
TETRAHYDROCANNABINOL: NOT DETECTED
Tetrahydrocannabinol: NOT DETECTED

## 2016-01-26 LAB — CBC
HEMATOCRIT: 35.8 % — AB (ref 39.0–52.0)
HEMOGLOBIN: 12.1 g/dL — AB (ref 13.0–17.0)
MCH: 28.3 pg (ref 26.0–34.0)
MCHC: 33.8 g/dL (ref 30.0–36.0)
MCV: 83.6 fL (ref 78.0–100.0)
Platelets: 227 10*3/uL (ref 150–400)
RBC: 4.28 MIL/uL (ref 4.22–5.81)
RDW: 11.5 % (ref 11.5–15.5)
WBC: 4.3 10*3/uL (ref 4.0–10.5)

## 2016-01-26 LAB — URINALYSIS, ROUTINE W REFLEX MICROSCOPIC
BILIRUBIN URINE: NEGATIVE
GLUCOSE, UA: NEGATIVE mg/dL
HGB URINE DIPSTICK: NEGATIVE
Ketones, ur: NEGATIVE mg/dL
Leukocytes, UA: NEGATIVE
Nitrite: NEGATIVE
PH: 7.5 (ref 5.0–8.0)
Protein, ur: NEGATIVE mg/dL
SPECIFIC GRAVITY, URINE: 1.019 (ref 1.005–1.030)

## 2016-01-26 LAB — ECHOCARDIOGRAM COMPLETE
E decel time: 218 msec
EERAT: 15.43
FS: 36 % (ref 28–44)
IVS/LV PW RATIO, ED: 0.84
LA vol A4C: 46.8 ml
LA vol index: 25.7 mL/m2
LADIAMINDEX: 1.54 cm/m2
LASIZE: 35 mm
LAVOL: 58.5 mL
LEFT ATRIUM END SYS DIAM: 35 mm
LV E/e'average: 15.43
LV PW d: 10.2 mm — AB (ref 0.6–1.1)
LV e' LATERAL: 6.37 cm/s
LVEEMED: 15.43
LVOT VTI: 25 cm
LVOT area: 4.15 cm2
LVOT diameter: 23 mm
LVOT peak grad rest: 9 mmHg
LVOT peak vel: 152 cm/s
LVOTSV: 104 mL
MV Dec: 218
MV pk A vel: 46.1 m/s
MVPG: 4 mmHg
MVPKEVEL: 98.3 m/s
RV LATERAL S' VELOCITY: 16.8 cm/s
RV TAPSE: 16.6 mm
TDI e' lateral: 6.37
TDI e' medial: 7.23

## 2016-01-26 LAB — RPR: RPR: NONREACTIVE

## 2016-01-26 LAB — SEDIMENTATION RATE: SED RATE: 48 mm/h — AB (ref 0–16)

## 2016-01-26 LAB — PROCALCITONIN: PROCALCITONIN: 9.34 ng/mL

## 2016-01-26 LAB — BASIC METABOLIC PANEL
ANION GAP: 11 (ref 5–15)
BUN: 8 mg/dL (ref 6–20)
CO2: 25 mmol/L (ref 22–32)
Calcium: 8.4 mg/dL — ABNORMAL LOW (ref 8.9–10.3)
Chloride: 100 mmol/L — ABNORMAL LOW (ref 101–111)
Creatinine, Ser: 1.14 mg/dL (ref 0.61–1.24)
GLUCOSE: 123 mg/dL — AB (ref 65–99)
POTASSIUM: 4 mmol/L (ref 3.5–5.1)
Sodium: 136 mmol/L (ref 135–145)

## 2016-01-26 LAB — C-REACTIVE PROTEIN: CRP: 12.3 mg/dL — ABNORMAL HIGH (ref <1.0)

## 2016-01-26 LAB — LACTIC ACID, PLASMA
LACTIC ACID, VENOUS: 0.7 mmol/L (ref 0.5–1.9)
Lactic Acid, Venous: 1.5 mmol/L (ref 0.5–1.9)

## 2016-01-26 LAB — APTT: aPTT: 38 seconds — ABNORMAL HIGH (ref 24–36)

## 2016-01-26 LAB — INFLUENZA PANEL BY PCR (TYPE A & B)
Influenza A By PCR: NEGATIVE
Influenza B By PCR: NEGATIVE

## 2016-01-26 MED ORDER — SODIUM CHLORIDE 0.9% FLUSH
3.0000 mL | Freq: Two times a day (BID) | INTRAVENOUS | Status: DC
  Administered 2016-01-26 – 2016-01-31 (×4): 3 mL via INTRAVENOUS

## 2016-01-26 MED ORDER — ACETAMINOPHEN 325 MG PO TABS
650.0000 mg | ORAL_TABLET | Freq: Four times a day (QID) | ORAL | Status: DC | PRN
  Administered 2016-01-26 – 2016-01-29 (×4): 650 mg via ORAL
  Filled 2016-01-26 (×3): qty 2

## 2016-01-26 MED ORDER — SODIUM CHLORIDE 0.9 % IV SOLN
1.0000 g | Freq: Three times a day (TID) | INTRAVENOUS | Status: DC
  Administered 2016-01-26 – 2016-01-30 (×13): 1 g via INTRAVENOUS
  Filled 2016-01-26 (×14): qty 1

## 2016-01-26 MED ORDER — ACETAMINOPHEN 325 MG PO TABS
ORAL_TABLET | ORAL | Status: AC
  Filled 2016-01-26: qty 2

## 2016-01-26 MED ORDER — ONDANSETRON HCL 4 MG/2ML IJ SOLN: 4.0000 mg | Freq: Three times a day (TID) | INTRAMUSCULAR | Status: DC | PRN

## 2016-01-26 MED ORDER — NICOTINE 21 MG/24HR TD PT24
21.0000 mg | MEDICATED_PATCH | Freq: Every day | TRANSDERMAL | Status: DC
  Filled 2016-01-26 (×4): qty 1

## 2016-01-26 MED ORDER — VANCOMYCIN IV (FOR PTA / DISCHARGE USE ONLY): 1250.0000 mg | Freq: Three times a day (TID) | INTRAVENOUS | Status: DC

## 2016-01-26 MED ORDER — OXYCODONE-ACETAMINOPHEN 5-325 MG PO TABS
ORAL_TABLET | ORAL | Status: AC
  Filled 2016-01-26: qty 2

## 2016-01-26 MED ORDER — DEXTROSE 5 % IV SOLN
2.0000 g | Freq: Three times a day (TID) | INTRAVENOUS | Status: DC
  Filled 2016-01-26 (×2): qty 2

## 2016-01-26 MED ORDER — SODIUM CHLORIDE 0.9 % IV SOLN
INTRAVENOUS | Status: DC
  Administered 2016-01-26 – 2016-01-29 (×5): via INTRAVENOUS

## 2016-01-26 MED ORDER — VANCOMYCIN HCL 10 G IV SOLR
1250.0000 mg | Freq: Three times a day (TID) | INTRAVENOUS | Status: DC
  Administered 2016-01-26 – 2016-01-27 (×4): 1250 mg via INTRAVENOUS
  Filled 2016-01-26 (×10): qty 1250

## 2016-01-26 MED ORDER — DEXTROSE 5 % IV SOLN: 2.0000 g | INTRAVENOUS | Status: DC

## 2016-01-26 MED ORDER — ENOXAPARIN SODIUM 40 MG/0.4ML ~~LOC~~ SOLN
40.0000 mg | SUBCUTANEOUS | Status: DC
  Administered 2016-01-26 – 2016-01-31 (×6): 40 mg via SUBCUTANEOUS
  Filled 2016-01-26 (×6): qty 0.4

## 2016-01-26 MED ORDER — OXYCODONE-ACETAMINOPHEN 5-325 MG PO TABS
1.0000 | ORAL_TABLET | ORAL | Status: DC | PRN
  Administered 2016-01-26 – 2016-01-28 (×12): 2 via ORAL
  Filled 2016-01-26 (×11): qty 2

## 2016-01-26 NOTE — Progress Notes (Signed)
Patient received from the ED and oriented to room. Mother at bedside. Tele monitoring called in. Patient very agitated. Ed tech states the patient was hollering out while she was bringing him up here. Gave him pain medicine. Will continue to monitor, call light within reach. Will try to do skin assessment later on after patient calms down. Call light within reach.

## 2016-01-26 NOTE — Progress Notes (Signed)
Pharmacy Antibiotic Note  Ryan Guerra is a 25 y.o. male admitted on 01/25/2016 with fever. Noted pt with recent admission for septic joint. Discharged home on Rocephin and Vancomycin.  Plan was to continue for 6 weeks (end date of 02/26/16) per ID. Pt now presents also with rash for past 2 days- Dr. Clyde LundborgNiu suspects this is due to Rocephin. Pharmacy has been consulted for vancomycin and meropenem dosing.  Plan: Continue vancomycin 1.25g IV Q8. Check Css with 4th inpt dose. Meropenem 1gm IV q8h Monitor clinical picture, renal function, and micro data    Temp (24hrs), Avg:102.2 F (39 C), Min:102.2 F (39 C), Max:102.2 F (39 C)   Recent Labs Lab 01/19/16 0711 01/25/16 2045 01/25/16 2102 01/25/16 2350  WBC  --  5.4  --   --   CREATININE  --  1.09  --   --   LATICACIDVEN  --   --  2.06* 0.52  VANCOTROUGH 9*  --   --   --     Estimated Creatinine Clearance: 134.1 mL/min (by C-G formula based on SCr of 1.09 mg/dL).    No Known Allergies  Antimicrobials:  Vancomycin 11/22 >> Meropenem 12/2>> Rocephin 11/24 >>12/1 Cefepime x 1 12/1  Dose adjustments:  11/25 VT = 9 (On 1.5g Q12; increase dose to 1,250mg  Q8)  Microbiology results:  11/22 Deep Wound Cx: negative 12/1 Bld cx x2: 12/1 Urine cx:  Thank you for allowing pharmacy to be a part of this patient's care.  Christoper Fabianaron Bonnetta Allbee, PharmD, BCPS Clinical pharmacist, pager [phone] 01/26/2016 2:09 AM

## 2016-01-26 NOTE — H&P (Signed)
History and Physical    Ryan Guerra GHW:[id number] DOB: 05-25-1990 DOA: 01/25/2016  Referring MD/NP/PA:   PCP: Benito Mccreedy, MD   Patient coming from:  The patient is coming from home.  At baseline, pt is independent for most of ADL.     Chief Complaint: Fever, chills, rash  HPI: Ryan Guerra is a 25 y.o. male with medical history significant of recent pyogenic arthritis of right shoulder region(s/p of I&D, on IV vancomycin Rocephin), bipolar disorder, tobacco abuse, GERD, who presents with fever, chills, rashes.   Patient was recently hospitalized from 11/22-11/25 due to pyogenic arthritis of right shoulder region. He is s/p of I&D. He was discharged on IV vancomycin and Rocephin via PICC line. Pt states that he has been compliant to IV antibiotics. He developed fever and chills yesterday. He has whole body aching, fatigue. denies runny nose, sore throat, cough, shortness of breath or chest pain. No symptoms of UTI. No GI symptoms. He developed diffused erythematous rashes today. Pt state that his right shoulder has been healing well and only has mild pain. Patient had hypotension with blood pressure 84/74, which improved to SBP>90 with IVF.   ED Course: pt was found to have  WBC 5.4, lactate 2.06, 0.52, INR 1.25, electrolytes okay, flu PCR negative, pending urinalysis, negative chest x-ray, temperature 102.2, tachycardia, tachypnea, oxygen saturation 93% on room air. Pt is admitted to telemetry bed as inpatient.  Review of Systems:   General: has fevers, chills, no changes in body weight, has poor appetite, has fatigue. HEENT: no blurry vision, hearing changes or sore throat Respiratory: no dyspnea, coughing, wheezing CV: no chest pain, no palpitations GI: no nausea, vomiting, abdominal pain, diarrhea, constipation GU: no dysuria, burning on urination, increased urinary frequency, hematuria  Ext: no leg edema Neuro: no unilateral weakness, numbness, or tingling, no  vision change or hearing loss Skin: has rash MSK: No muscle spasm, no deformity, no limitation of range of movement in spin Heme: No easy bruising.  Travel history: No recent long distant travel.  Allergy: No Known Allergies  Past Medical History:  Diagnosis Date  . Bipolar disorder ([place])   . GERD (gastroesophageal reflux disease)    no current med.  Sallee Provencal nose 12/25/2015   clear drainage, per pt.  . 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 Sore throat 12/25/2015    Past Surgical History:  Procedure Laterality Date  . INCISION AND DRAINAGE Right 01/16/2016   shoulder  . IRRIGATION AND DEBRIDEMENT SHOULDER Right 01/16/2016   Procedure: IRRIGATION AND DEBRIDEMENT SHOULDER;  Surgeon: Tania Ade, MD;  Location: [facility];  Service: Orthopedics;  Laterality: Right;  Irrigation and debridement right shoulder infection  . SHOULDER LATERJET Right 12/31/2015   Procedure: OPEN CORACOID TRANSFER;  Surgeon: Tania Ade, MD;  Location: [facility];  Service: Orthopedics;  Laterality: Right;  . 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  He has a 2.00 pack-year smoking history. He has quit using smokeless tobacco. He reports that he drinks about 0.6 oz of alcohol per week . He reports that he does not use drugs.  Family History: Reviewed with patient, mom is healthy. Patient does not know other family member medical history     Prior to Admission medications   Medication Sig Start Date End Date Taking? Authorizing Provider  acetaminophen (TYLENOL) 325 MG tablet Take 650 mg by mouth every 6 (six) hours  as needed for mild pain.   Yes Historical Provider, MD  cefTRIAXone (ROCEPHIN) IVPB Inject 2 g into the vein daily. Indication:  Septic joint Last Day of Therapy: 02/28/16 Labs - Once weekly:  CBC/D and BMP, Labs - Every other week:  ESR and CRP 01/19/16 02/28/16 Yes Danielle Laliberte, PA-C    oxyCODONE-acetaminophen (PERCOCET/ROXICET) 5-325 MG tablet Take 1-2 tablets by mouth every 4 (four) hours as needed for severe pain. 01/18/16  Yes Danielle Laliberte, PA-C  vancomycin IVPB Inject 1,250 mg into the vein every 8 (eight) hours. Indication:  Septic joint  Last Day of Therapy: 02/28/16 Labs - Sunday/Monday:  CBC/D, BMP, and vancomycin trough. Labs - Thursday:  BMP and vancomycin trough Labs - Every other week:  ESR and CRP 01/19/16 02/28/16 Yes Grier Mitts, PA-C    Physical Exam: Vitals:   01/26/16 0030 01/26/16 0100 01/26/16 0220 01/26/16 0534  BP: 117/69 122/80 (!) 116/47 (!) 98/47  Pulse: 87 93 100 91  Resp: (!) 27 26 (!) 24 20  Temp:   98.5 F (36.9 C) 98.3 F (36.8 C)  TempSrc:   Oral Oral  SpO2: 100% 100% 100% 98%   General: Not in acute distress HEENT:       Eyes: PERRL, EOMI, no scleral icterus.       ENT: No discharge from the ears and nose, no pharynx injection, no tonsillar enlargement.        Neck: No JVD, no bruit, no mass felt. Heme: No neck lymph node enlargement. Cardiac: S1/S2, RRR, No murmurs, No gallops or rubs. Respiratory: No rales, wheezing, rhonchi or rubs. GI: Soft, nondistended, nontender, no rebound pain, no organomegaly, BS present. GU: No hematuria Ext: No pitting leg edema bilaterally. 2+DP/PT pulse bilaterally. Right shoulder surgical site healing well without tenderness or swelling. Musculoskeletal: No joint deformities, No joint redness or warmth, no limitation of ROM in spin. Skin: Has diffused erythematous rashes in torso and extremeties Neuro: Alert, oriented X3, cranial nerves II-XII grossly intact, moves all extremities normally. Psych: Patient is not psychotic, no suicidal or hemocidal ideation.  Labs on Admission: I have personally reviewed following labs and imaging studies  CBC:  Recent Labs Lab 01/25/16 2045 01/26/16 0205  WBC 5.4 4.3  NEUTROABS 4.2  --   HGB 12.6* 12.1*  HCT 36.3* 35.8*  MCV 83.1 83.6  PLT  257 161   Basic Metabolic Panel:  Recent Labs Lab 01/25/16 2045 01/26/16 0205  NA 135 136  K 3.5 4.0  CL 98* 100*  CO2 24 25  GLUCOSE 109* 123*  BUN 8 8  CREATININE 1.09 1.14  CALCIUM 8.6* 8.4*   GFR: Estimated Creatinine Clearance: 128.2 mL/min (by C-G formula based on SCr of 1.14 mg/dL). Liver Function Tests:  Recent Labs Lab 01/25/16 2045  AST 45*  ALT 55  ALKPHOS 76  BILITOT 0.5  PROT 6.7  ALBUMIN 3.1*   No results for input(s): LIPASE, AMYLASE in the last 168 hours. No results for input(s): AMMONIA in the last 168 hours. Coagulation Profile:  Recent Labs Lab 01/25/16 2045  INR 1.25   Cardiac Enzymes: No results for input(s): CKTOTAL, CKMB, CKMBINDEX, TROPONINI in the last 168 hours. BNP (last 3 results) No results for input(s): PROBNP in the last 8760 hours. HbA1C: No results for input(s): HGBA1C in the last 72 hours. CBG: No results for input(s): GLUCAP in the last 168 hours. Lipid Profile: No results for input(s): CHOL, HDL, LDLCALC, TRIG, CHOLHDL, LDLDIRECT in the last 72 hours. Thyroid  Function Tests: No results for input(s): TSH, T4TOTAL, FREET4, T3FREE, THYROIDAB in the last 72 hours. Anemia Panel: No results for input(s): VITAMINB12, FOLATE, FERRITIN, TIBC, IRON, RETICCTPCT in the last 72 hours. Urine analysis: No results found for: COLORURINE, APPEARANCEUR, LABSPEC, [place], GLUCOSEU, HGBUR, BILIRUBINUR, [place], [place], UROBILINOGEN, NITRITE, LEUKOCYTESUR Sepsis Labs: '@[email or web address]' (procalcitonin:4,lacticidven:4) ) Recent Results (from the past 240 hour(s))  Aerobic/Anaerobic Culture (surgical/deep wound)     Status: None   Collection Time: 01/16/16  5:09 PM  Result Value Ref Range Status   Specimen Description ABSCESS RIGHT SHOULDER  Final   Special Requests NONE  Final   Gram Stain   Final    ABUNDANT WBC PRESENT,BOTH PMN AND MONONUCLEAR NO ORGANISMS SEEN    Culture No growth aerobically or anaerobically.  Final   Report Status  01/21/2016 FINAL  Final     Radiological Exams on Admission: Dg Chest 2 View  Result Date: 01/25/2016 CLINICAL DATA:  Acute onset of fever and tachycardia. Generalized weakness. Red rash over the right shoulder. Initial encounter. EXAM: CHEST  2 VIEW COMPARISON:  Chest radiograph performed 09/25/2007 FINDINGS: A left PICC is noted ending overlying the mid SVC. The lungs are well-aerated. Pulmonary vascularity is at the upper limits of normal. There is no evidence of focal opacification, pleural effusion or pneumothorax. The heart is normal in size; the mediastinal contour is within normal limits. No acute osseous abnormalities are seen. Postoperative change is noted at the right glenoid, with mild chronic deformity. IMPRESSION: No acute cardiopulmonary process seen. Electronically Signed   By: Garald Balding M.D.   On: 01/25/2016 22:29     EKG:  Not done in ED, will get one.   Assessment/Plan Principal Problem:   Sepsis ([place]) Active Problems:   Pyogenic arthritis of right shoulder region [facility])   Rash   Tobacco abuse   Sepsis [facility]): Patient had hypotension, leukocytosis, elevated lactate, consistent with severe sepsis. Etiology is not clear. Patient's right shoulder infection seems to be under control.  Potential differential diagnosis is endocarditis. There is report in the medical record of IV drug abuse the patient denies this time. Will switch his Rocephin to ertapenem for Pseudomonas coverage. Patient has rashes with an clear etiology, possible allergic reaction to Rocephin, not a typical since patient has been taking it for more than week.   -will admit to tele bed as inpt -continue vancomycin -switch Rocephin to meropenem after discussed with the pharmacist (pt received one dose of cefepime in ED) -f/u Bx and UA, Ux -2d echo -check RPR -will get Procalcitonin and trend lactic acid levels per sepsis protocol. -IVF: 2.25L of NS bolus in ED, followed by 125 cc/h     ED Sepsis -  Assessment   Performed at:    6:45 AM   Last Vitals:    Blood pressure (!) 98/47, pulse 91, temperature 98.3 F (36.8 C), temperature source Oral, resp. rate 20, SpO2 98 %.  Heart:                 Regular rhythm Tachchycarida  Lungs:     Clear to auscultation  Capillary Refill:   <2 seconds    Peripheral Pulse (include location): Brachial pulse palpable   Skin (include color):              Normal   Pyogenic arthritis of right shoulder region [facility]): s/p of I&D. Seems to be under control now. -adjusted Abx as above -prn percocet for pain  Rash: unclear etiology, possible allergic reaction  to Rocephin. Other differential diagnosis is endocarditis. -Antibiotics as above -Follow-up RPR  Tobacco abuse: -Did counseling about importance of quitting smoking -Nicotine patch   DVT ppx: SCD Code Status: Full code Family Communication: Yes, patient's mo mother at bed side Disposition Plan:  Anticipate discharge back to previous home environment Consults called: none Admission status: Inpatient/tele   Date of Service 01/26/2016    Ivor Costa [facility] Pager [phone]  If 7PM-7AM, please contact night-coverage [email or web address] Password [email or web address] 01/26/2016, 6:06 AM

## 2016-01-26 NOTE — Consult Note (Signed)
[facility] for Infectious Disease  Date of Admission:  01/25/2016  Date of Consult:  01/26/2016  Reason for Consult: Septic Arthritis Referring Physician: Broadus John  Impression/Recommendation Septic Arthritis with hardware Adverse Drug Reaction  Will watch on merrem, vanco Perhaps this is cephalosporin allergy He does not have flu sx, will stop isolation. Also his PCR is negative.  Would consider removing PIC (he does not want to do this) if he has more fever.  Will check BCx to see if he has been manipulating line.    Thank you so much for this interesting consult,   Bobby Rumpf (pager) [phone] [email or web address]  Ryan Guerra is an 25 y.o. male.  HPI: 25 y.o. male who underwent an open coracoid transfer shoulder surgery on 12/31/2015. His course has been complicated by swelling and redness of the site with concern for infection. This site was evaluated in office, purulent material aspirated. He was admitted 11-22 and underwent irrigation and debridement, the joint was not obviously damaged but infected fluid did extend to this space. The screws and bone graft were intact. There is not obvious evidence of bone infection or loosening of the screws. He was d/c home on 11-25 with vancomycin and ceftriaxone.  His course has been complicated by persistent pain and swelling in his shoulder. His anbx were changed to vanco/cefepime on 11-28. He returned to hospital on 12-1 with fever 102.2 and chills. His WBC was normal. His cefepime was changed to merrem. Vanco was continued.  States he has had fever for 4 days.  States his shoulder swelling and pain are much better.    Past Medical History:  Diagnosis Date  . Bipolar disorder ([place])   . GERD (gastroesophageal reflux disease)    no current med.  Sallee Provencal nose 12/25/2015   clear drainage, per pt.  . 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 Sore throat 12/25/2015    Past Surgical History:  Procedure Laterality  Date  . INCISION AND DRAINAGE Right 01/16/2016   shoulder  . IRRIGATION AND DEBRIDEMENT SHOULDER Right 01/16/2016   Procedure: IRRIGATION AND DEBRIDEMENT SHOULDER;  Surgeon: Tania Ade, MD;  Location: [place];  Service: Orthopedics;  Laterality: Right;  Irrigation and debridement right shoulder infection  . SHOULDER LATERJET Right 12/31/2015   Procedure: OPEN CORACOID TRANSFER;  Surgeon: Tania Ade, MD;  Location: [place];  Service: Orthopedics;  Laterality: Right;  . 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 acetaminophen      . enoxaparin (LOVENOX) injection  40 mg Subcutaneous Q24H  . meropenem (MERREM) IV  1 g Intravenous Q8H  . nicotine  21 mg Transdermal Daily  . oxyCODONE-acetaminophen      . sodium chloride flush  3 mL Intravenous Q12H  . vancomycin  1,250 mg Intravenous Q8H    Abtx:  Anti-infectives    Start     Dose/Rate Route Frequency Ordered Stop   01/26/16 0800  vancomycin (VANCOCIN) 1,250 mg in sodium chloride 0.9 % 250 mL IVPB     1,250 mg 166.7 mL/hr over 90 Minutes Intravenous Every 8 hours 01/26/16 0206     01/26/16 0600  ceFEPIme (MAXIPIME) 2 g in dextrose 5 % 50 mL IVPB  Status:  Discontinued     2 g 100 mL/hr over 30 Minutes Intravenous Every 8 hours 01/26/16 0206 01/26/16 0210   01/26/16 0230  meropenem (MERREM) 1 g in sodium chloride  0.9 % 100 mL IVPB     1 g 200 mL/hr over 30 Minutes Intravenous Every 8 hours 01/26/16 0213     01/26/16 0200  vancomycin IVPB  Status:  Discontinued    Comments:  Indication:  Septic joint  Last Day of Therapy: 02/28/16 Labs - Sunday/Monday:  CBC/D, BMP, and vancomycin trough. Labs - Thursday:  BMP and vancomycin trough Labs - Every other week:  ESR and CRP     1,250 mg Intravenous Every 8 hours 01/26/16 0152 01/26/16 0204   01/26/16 0200  ceFEPIme (MAXIPIME) 2 g in dextrose 5 % 50 mL IVPB  Status:  Discontinued     2 g 100 mL/hr over 30  Minutes Intravenous Every 24 hours 01/26/16 0159 01/26/16 0204   01/25/16 2215  vancomycin (VANCOCIN) 1,250 mg in sodium chloride 0.9 % 250 mL IVPB     1,250 mg 166.7 mL/hr over 90 Minutes Intravenous  Once 01/25/16 2212 01/26/16 0106   01/25/16 2215  ceFEPIme (MAXIPIME) 2 g in dextrose 5 % 50 mL IVPB     2 g 100 mL/hr over 30 Minutes Intravenous  Once 01/25/16 2212 01/25/16 2320      Total days of antibiotics: 9       Social History:  reports that he has been smoking Cigarettes.  He has a 2.00 pack-year smoking history. He has quit using smokeless tobacco. He reports that he drinks about 0.6 oz of alcohol per week . He reports that he does not use drugs.  No family history on file.  General ROS: no cough/sore throat/rhinorrhea. normal BM, normal urine. no problems with PIC line/drainge/erythema. normal urination. rash x~ 48h. see HPI  Blood pressure (!) 87/53, pulse 85, temperature 98 F (36.7 C), temperature source Oral, resp. rate 18, SpO2 100 %. General appearance: alert, cooperative and no distress Eyes: negative findings: conjunctivae and sclerae normal and pupils equal, round, reactive to light and accomodation Throat: lips, mucosa, and tongue normal; teeth and gums normal Neck: no adenopathy and supple, symmetrical, trachea midline Lungs: clear to auscultation bilaterally Heart: regular rate and rhythm Abdomen: normal findings: bowel sounds normal and soft, non-tender Extremities: LUE PIC is clean, non-tender. no proximal cordis. R shoulder is warm, mild swelling. non-tender. No LE edema.  Skin: erythema - generalized   Results for orders placed or performed during the hospital encounter of 01/25/16 (from the past 48 hour(s))  Comprehensive metabolic panel     Status: Abnormal   Collection Time: 01/25/16  8:45 PM  Result Value Ref Range   Sodium 135 135 - 145 mmol/L   Potassium 3.5 3.5 - 5.1 mmol/L   Chloride 98 (L) 101 - 111 mmol/L   CO2 24 22 - 32 mmol/L   Glucose,  Bld 109 (H) 65 - 99 mg/dL   BUN 8 6 - 20 mg/dL   Creatinine, Ser 1.09 0.61 - 1.24 mg/dL   Calcium 8.6 (L) 8.9 - 10.3 mg/dL   Total Protein 6.7 6.5 - 8.1 g/dL   Albumin 3.1 (L) 3.5 - 5.0 g/dL   AST 45 (H) 15 - 41 U/L   ALT 55 17 - 63 U/L   Alkaline Phosphatase 76 38 - 126 U/L   Total Bilirubin 0.5 0.3 - 1.2 mg/dL   GFR calc non Af Amer >60 >60 mL/min   GFR calc Af Amer >60 >60 mL/min    Comment: (NOTE) The eGFR has been calculated using the CKD EPI equation. This calculation has not been validated in all  clinical situations. eGFR's persistently <60 mL/min signify possible Chronic Kidney Disease.    Anion gap 13 5 - 15  CBC with Differential     Status: Abnormal   Collection Time: 01/25/16  8:45 PM  Result Value Ref Range   WBC 5.4 4.0 - 10.5 K/uL   RBC 4.37 4.22 - 5.81 MIL/uL   Hemoglobin 12.6 (L) 13.0 - 17.0 g/dL   HCT 36.3 (L) 39.0 - 52.0 %   MCV 83.1 78.0 - 100.0 fL   MCH 28.8 26.0 - 34.0 pg   MCHC 34.7 30.0 - 36.0 g/dL   RDW 11.4 (L) 11.5 - 15.5 %   Platelets 257 150 - 400 K/uL   Neutrophils Relative % 77 %   Neutro Abs 4.2 1.7 - 7.7 K/uL   Lymphocytes Relative 16 %   Lymphs Abs 0.9 0.7 - 4.0 K/uL   Monocytes Relative 5 %   Monocytes Absolute 0.3 0.1 - 1.0 K/uL   Eosinophils Relative 2 %   Eosinophils Absolute 0.1 0.0 - 0.7 K/uL   Basophils Relative 0 %   Basophils Absolute 0.0 0.0 - 0.1 K/uL  Protime-INR     Status: Abnormal   Collection Time: 01/25/16  8:45 PM  Result Value Ref Range   Prothrombin Time 15.8 (H) 11.4 - 15.2 seconds   INR 1.25   I-Stat CG4 Lactic Acid, ED     Status: Abnormal   Collection Time: 01/25/16  9:02 PM  Result Value Ref Range   Lactic Acid, Venous 2.06 (HH) 0.5 - 1.9 mmol/L   Comment NOTIFIED PHYSICIAN   I-Stat CG4 Lactic Acid, ED     Status: None   Collection Time: 01/25/16 11:50 PM  Result Value Ref Range   Lactic Acid, Venous 0.52 0.5 - 1.9 mmol/L  C-reactive protein     Status: Abnormal   Collection Time: 01/26/16  2:05 AM    Result Value Ref Range   CRP 12.3 (H) <1.0 mg/dL  Sedimentation rate     Status: Abnormal   Collection Time: 01/26/16  2:05 AM  Result Value Ref Range   Sed Rate 48 (H) 0 - 16 mm/hr  Basic metabolic panel     Status: Abnormal   Collection Time: 01/26/16  2:05 AM  Result Value Ref Range   Sodium 136 135 - 145 mmol/L   Potassium 4.0 3.5 - 5.1 mmol/L   Chloride 100 (L) 101 - 111 mmol/L   CO2 25 22 - 32 mmol/L   Glucose, Bld 123 (H) 65 - 99 mg/dL   BUN 8 6 - 20 mg/dL   Creatinine, Ser 1.14 0.61 - 1.24 mg/dL   Calcium 8.4 (L) 8.9 - 10.3 mg/dL   GFR calc non Af Amer >60 >60 mL/min   GFR calc Af Amer >60 >60 mL/min    Comment: (NOTE) The eGFR has been calculated using the CKD EPI equation. This calculation has not been validated in all clinical situations. eGFR's persistently <60 mL/min signify possible Chronic Kidney Disease.    Anion gap 11 5 - 15  CBC     Status: Abnormal   Collection Time: 01/26/16  2:05 AM  Result Value Ref Range   WBC 4.3 4.0 - 10.5 K/uL   RBC 4.28 4.22 - 5.81 MIL/uL   Hemoglobin 12.1 (L) 13.0 - 17.0 g/dL   HCT 35.8 (L) 39.0 - 52.0 %   MCV 83.6 78.0 - 100.0 fL   MCH 28.3 26.0 - 34.0 pg   MCHC 33.8 30.0 -  36.0 g/dL   RDW 11.5 11.5 - 15.5 %   Platelets 227 150 - 400 K/uL  Lactic acid, plasma     Status: None   Collection Time: 01/26/16  2:05 AM  Result Value Ref Range   Lactic Acid, Venous 1.5 0.5 - 1.9 mmol/L  Procalcitonin     Status: None   Collection Time: 01/26/16  2:05 AM  Result Value Ref Range   Procalcitonin 9.34 ng/mL    Comment:        Interpretation: PCT > 2 ng/mL: Systemic infection (sepsis) is likely, unless other causes are known. (NOTE)         ICU PCT Algorithm               Non ICU PCT Algorithm    ----------------------------     ------------------------------         PCT < 0.25 ng/mL                 PCT < 0.1 ng/mL     Stopping of antibiotics            Stopping of antibiotics       strongly encouraged.               strongly  encouraged.    ----------------------------     ------------------------------       PCT level decrease by               PCT < 0.25 ng/mL       >= 80% from peak PCT       OR PCT 0.25 - 0.5 ng/mL          Stopping of antibiotics                                             encouraged.     Stopping of antibiotics           encouraged.    ----------------------------     ------------------------------       PCT level decrease by              PCT >= 0.25 ng/mL       < 80% from peak PCT        AND PCT >= 0.5 ng/mL            Continuing antibiotics                                               encouraged.       Continuing antibiotics            encouraged.    ----------------------------     ------------------------------     PCT level increase compared          PCT > 0.5 ng/mL         with peak PCT AND          PCT >= 0.5 ng/mL             Escalation of antibiotics  strongly encouraged.      Escalation of antibiotics        strongly encouraged.   APTT     Status: Abnormal   Collection Time: 01/26/16  2:05 AM  Result Value Ref Range   aPTT 38 (H) 24 - 36 seconds    Comment:        IF BASELINE aPTT IS ELEVATED, SUGGEST PATIENT RISK ASSESSMENT BE USED TO DETERMINE APPROPRIATE ANTICOAGULANT THERAPY.   Influenza panel by PCR (type A & B, H1N1)     Status: None   Collection Time: 01/26/16  2:31 AM  Result Value Ref Range   Influenza A By PCR NEGATIVE NEGATIVE   Influenza B By PCR NEGATIVE NEGATIVE    Comment: (NOTE) The Xpert Xpress Flu assay is intended as an aid in the diagnosis of  influenza and should not be used as a sole basis for treatment.  This  assay is FDA approved for nasopharyngeal swab specimens only. Nasal  washings and aspirates are unacceptable for Xpert Xpress Flu testing.   Lactic acid, plasma     Status: None   Collection Time: 01/26/16  5:40 AM  Result Value Ref Range   Lactic Acid, Venous 0.7 0.5 - 1.9 mmol/L        Component Value Date/Time   SDES ABSCESS RIGHT SHOULDER 01/16/2016 1709   SPECREQUEST NONE 01/16/2016 1709   CULT No growth aerobically or anaerobically. 01/16/2016 1709   REPTSTATUS 01/21/2016 FINAL 01/16/2016 1709   Dg Chest 2 View  Result Date: 01/25/2016 CLINICAL DATA:  Acute onset of fever and tachycardia. Generalized weakness. Red rash over the right shoulder. Initial encounter. EXAM: CHEST  2 VIEW COMPARISON:  Chest radiograph performed 09/25/2007 FINDINGS: A left PICC is noted ending overlying the mid SVC. The lungs are well-aerated. Pulmonary vascularity is at the upper limits of normal. There is no evidence of focal opacification, pleural effusion or pneumothorax. The heart is normal in size; the mediastinal contour is within normal limits. No acute osseous abnormalities are seen. Postoperative change is noted at the right glenoid, with mild chronic deformity. IMPRESSION: No acute cardiopulmonary process seen. Electronically Signed   By: Garald Balding M.D.   On: 01/25/2016 22:29   Recent Results (from the past 240 hour(s))  Aerobic/Anaerobic Culture (surgical/deep wound)     Status: None   Collection Time: 01/16/16  5:09 PM  Result Value Ref Range Status   Specimen Description ABSCESS RIGHT SHOULDER  Final   Special Requests NONE  Final   Gram Stain   Final    ABUNDANT WBC PRESENT,BOTH PMN AND MONONUCLEAR NO ORGANISMS SEEN    Culture No growth aerobically or anaerobically.  Final   Report Status 01/21/2016 FINAL  Final      01/26/2016, 9:45 AM     LOS: 0 days    Records and images were personally reviewed where available.

## 2016-01-26 NOTE — ED Notes (Signed)
Report attemped x1

## 2016-01-26 NOTE — Progress Notes (Signed)
PROGRESS NOTE    Ryan IpChristian B Guerra  JXB:[id number] DOB: 1990-06-17 DOA: 01/25/2016 PCP: Jackie PlumSEI-BONSU,GEORGE, MD  Brief Narrative: 25 y.o.malewho underwent an open coracoid transfer shoulder surgery for dislocation on 12/31/2015.  Then developed post op infection and septic arthritis and had I&D on 11-22. Cultures negative, He was d/c home on 11-25 with vancomycin and ceftriaxone.  Then had some persistent pain and swelling in his shoulder, ceftriaxone was changed to cefepime on 11-28. He returned to hospital on 12-1 with fever 102.2 and chills, R shoulder feels fine  Assessment & Plan:  Fever -recent septic arthritis s/p I&D -no local symptoms -? Drug fever, has PICC line, denies IVDU, FU blood Cx -ID consulted, d/w Dr.Hatcher  R shoulder septic arthritis -Abx -Vanc and Merropenem now -no symptoms, monitor  Tobacco abuse: -ounseled about importance of quitting smoking -Nicotine patch  DVT ppx: lovenox Code Status: Full code Family Communication: no family at bed side Disposition Plan:  Home pending workup  Consultants:   ID Dr.Hatcher  Antimicrobials:Vanc/Merrem 12/1  Subjective: Hurts all over  Objective: Vitals:   01/26/16 0100 01/26/16 0220 01/26/16 0534 01/26/16 0800  BP: 122/80 (!) 116/47 (!) 98/47 (!) 87/53  Pulse: 93 100 91 85  Resp: 26 (!) 24 20 18   Temp:  98.5 F (36.9 C) 98.3 F (36.8 C) 98 F (36.7 C)  TempSrc:  Oral Oral Oral  SpO2: 100% 100% 98% 100%    Intake/Output Summary (Last 24 hours) at 01/26/16 1150 Last data filed at 01/26/16 0106  Gross per 24 hour  Intake             2050 ml  Output                0 ml  Net             2050 ml   There were no vitals filed for this visit.  Examination:  General exam: Appears calm and comfortable, AAOx3, no distress Respiratory system: Clear to auscultation. Respiratory effort normal. Cardiovascular system: S1 & S2 heard, RRR. No JVD, murmurs, rubs, gallops or clicks. No pedal  edema. Gastrointestinal system: Abdomen is nondistended, soft and nontender. No organomegaly or masses felt. Normal bowel sounds heard. Central nervous system: Alert and oriented. No focal neurological deficits. Extremities:R shoulder with healed surgical scar, no swelling or tenderness anywhere Skin: flushed Psychiatry: Judgement and insight appear normal. Mood & affect appropriate.     Data Reviewed: I have personally reviewed following labs and imaging studies  CBC:  Recent Labs Lab 01/25/16 2045 01/26/16 0205  WBC 5.4 4.3  NEUTROABS 4.2  --   HGB 12.6* 12.1*  HCT 36.3* 35.8*  MCV 83.1 83.6  PLT 257 227   Basic Metabolic Panel:  Recent Labs Lab 01/25/16 2045 01/26/16 0205  NA 135 136  K 3.5 4.0  CL 98* 100*  CO2 24 25  GLUCOSE 109* 123*  BUN 8 8  CREATININE 1.09 1.14  CALCIUM 8.6* 8.4*   GFR: Estimated Creatinine Clearance: 128.2 mL/min (by C-G formula based on SCr of 1.14 mg/dL). Liver Function Tests:  Recent Labs Lab 01/25/16 2045  AST 45*  ALT 55  ALKPHOS 76  BILITOT 0.5  PROT 6.7  ALBUMIN 3.1*   No results for input(s): LIPASE, AMYLASE in the last 168 hours. No results for input(s): AMMONIA in the last 168 hours. Coagulation Profile:  Recent Labs Lab 01/25/16 2045  INR 1.25   Cardiac Enzymes: No results for input(s): CKTOTAL, CKMB, CKMBINDEX, TROPONINI in  the last 168 hours. BNP (last 3 results) No results for input(s): PROBNP in the last 8760 hours. HbA1C: No results for input(s): HGBA1C in the last 72 hours. CBG: No results for input(s): GLUCAP in the last 168 hours. Lipid Profile: No results for input(s): CHOL, HDL, LDLCALC, TRIG, CHOLHDL, LDLDIRECT in the last 72 hours. Thyroid Function Tests: No results for input(s): TSH, T4TOTAL, FREET4, T3FREE, THYROIDAB in the last 72 hours. Anemia Panel: No results for input(s): VITAMINB12, FOLATE, FERRITIN, TIBC, IRON, RETICCTPCT in the last 72 hours. Urine analysis:    Component Value  Date/Time   COLORURINE YELLOW 01/25/2016 2048   APPEARANCEUR CLEAR 01/25/2016 2048   LABSPEC 1.019 01/25/2016 2048   PHURINE 7.5 01/25/2016 2048   GLUCOSEU NEGATIVE 01/25/2016 2048   HGBUR NEGATIVE 01/25/2016 2048   BILIRUBINUR NEGATIVE 01/25/2016 2048   KETONESUR NEGATIVE 01/25/2016 2048   PROTEINUR NEGATIVE 01/25/2016 2048   NITRITE NEGATIVE 01/25/2016 2048   LEUKOCYTESUR NEGATIVE 01/25/2016 2048   Sepsis Labs: @LABRCNTIP (procalcitonin:4,lacticidven:4)  ) Recent Results (from the past 240 hour(s))  Aerobic/Anaerobic Culture (surgical/deep wound)     Status: None   Collection Time: 01/16/16  5:09 PM  Result Value Ref Range Status   Specimen Description ABSCESS RIGHT SHOULDER  Final   Special Requests NONE  Final   Gram Stain   Final    ABUNDANT WBC PRESENT,BOTH PMN AND MONONUCLEAR NO ORGANISMS SEEN    Culture No growth aerobically or anaerobically.  Final   Report Status 01/21/2016 FINAL  Final         Radiology Studies: Dg Chest 2 View  Result Date: 01/25/2016 CLINICAL DATA:  Acute onset of fever and tachycardia. Generalized weakness. Red rash over the right shoulder. Initial encounter. EXAM: CHEST  2 VIEW COMPARISON:  Chest radiograph performed 09/25/2007 FINDINGS: A left PICC is noted ending overlying the mid SVC. The lungs are well-aerated. Pulmonary vascularity is at the upper limits of normal. There is no evidence of focal opacification, pleural effusion or pneumothorax. The heart is normal in size; the mediastinal contour is within normal limits. No acute osseous abnormalities are seen. Postoperative change is noted at the right glenoid, with mild chronic deformity. IMPRESSION: No acute cardiopulmonary process seen. Electronically Signed   By: Roanna RaiderJeffery  Chang M.D.   On: 01/25/2016 22:29        Scheduled Meds: . acetaminophen      . enoxaparin (LOVENOX) injection  40 mg Subcutaneous Q24H  . meropenem (MERREM) IV  1 g Intravenous Q8H  . nicotine  21 mg  Transdermal Daily  . oxyCODONE-acetaminophen      . sodium chloride flush  3 mL Intravenous Q12H  . vancomycin  1,250 mg Intravenous Q8H   Continuous Infusions: . sodium chloride 125 mL/hr at 01/26/16 0228     LOS: 0 days    Time spent: 35min    Zannie CovePreetha Britani Beattie, MD [facility] Pager [phone]  If 7PM-7AM, please contact night-coverage [email or web address] Password [email or web address] 01/26/2016, 11:50 AM

## 2016-01-27 ENCOUNTER — Inpatient Hospital Stay (HOSPITAL_COMMUNITY): Payer: Medicaid Other

## 2016-01-27 LAB — COMPREHENSIVE METABOLIC PANEL
ALBUMIN: 2.4 g/dL — AB (ref 3.5–5.0)
ALK PHOS: 72 U/L (ref 38–126)
ALT: 140 U/L — AB (ref 17–63)
AST: 122 U/L — AB (ref 15–41)
Anion gap: 8 (ref 5–15)
BILIRUBIN TOTAL: 0.4 mg/dL (ref 0.3–1.2)
BUN: 6 mg/dL (ref 6–20)
CALCIUM: 7.9 mg/dL — AB (ref 8.9–10.3)
CO2: 24 mmol/L (ref 22–32)
Chloride: 101 mmol/L (ref 101–111)
Creatinine, Ser: 0.94 mg/dL (ref 0.61–1.24)
GFR calc Af Amer: >60 mL/min (ref >60)
GFR calc non Af Amer: >60 mL/min (ref >60)
GLUCOSE: 99 mg/dL (ref 65–99)
Potassium: 3.3 mmol/L — ABNORMAL LOW (ref 3.5–5.1)
Sodium: 133 mmol/L — ABNORMAL LOW (ref 135–145)
TOTAL PROTEIN: 5.3 g/dL — AB (ref 6.5–8.1)

## 2016-01-27 LAB — CBC
HEMATOCRIT: 31.1 % — AB (ref 39.0–52.0)
HEMOGLOBIN: 10.6 g/dL — AB (ref 13.0–17.0)
MCH: 28.2 pg (ref 26.0–34.0)
MCHC: 34.1 g/dL (ref 30.0–36.0)
MCV: 82.7 fL (ref 78.0–100.0)
Platelets: 184 10*3/uL (ref 150–400)
RBC: 3.76 MIL/uL — ABNORMAL LOW (ref 4.22–5.81)
RDW: 11.7 % (ref 11.5–15.5)
WBC: 3.1 10*3/uL — AB (ref 4.0–10.5)

## 2016-01-27 LAB — VANCOMYCIN, TROUGH: Vancomycin Tr: 14 ug/mL — ABNORMAL LOW (ref 15–20)

## 2016-01-27 LAB — URINE CULTURE: CULTURE: NO GROWTH

## 2016-01-27 MED ORDER — VANCOMYCIN HCL 10 G IV SOLR
1500.0000 mg | Freq: Three times a day (TID) | INTRAVENOUS | Status: DC
  Administered 2016-01-27 – 2016-01-28 (×3): 1500 mg via INTRAVENOUS
  Filled 2016-01-27 (×5): qty 1500

## 2016-01-27 MED ORDER — INFLUENZA VAC SPLIT QUAD 0.5 ML IM SUSY
0.5000 mL | PREFILLED_SYRINGE | INTRAMUSCULAR | Status: DC
  Filled 2016-01-27: qty 0.5

## 2016-01-27 MED ORDER — SODIUM CHLORIDE 0.9% FLUSH
10.0000 mL | INTRAVENOUS | Status: DC | PRN
  Administered 2016-01-27 – 2016-01-30 (×4): 10 mL
  Filled 2016-01-27 (×3): qty 40

## 2016-01-27 MED ORDER — GADOBENATE DIMEGLUMINE 529 MG/ML IV SOLN
15.0000 mL | Freq: Once | INTRAVENOUS | Status: AC | PRN
  Administered 2016-01-27: 15 mL via INTRAVENOUS

## 2016-01-27 NOTE — Progress Notes (Signed)
PROGRESS NOTE    Ryan Guerra  ZOX:[id number] DOB: 1991/02/13 DOA: 01/25/2016 PCP: Jackie PlumSEI-BONSU,GEORGE, MD  Brief Narrative: 25 y.o.malewho underwent an open coracoid transfer shoulder surgery for dislocation on 12/31/2015.  Then developed post op infection and septic arthritis and had I&D on 11-22. Cultures negative, He was d/c home on 11-25 with vancomycin and ceftriaxone.  Then had some persistent pain and swelling in his shoulder, ceftriaxone was changed to cefepime on 11-28. He returned to hospital on 12-1 with fever 102.2 and chills, R shoulder feels fine  Assessment & Plan:  Fever -recent septic arthritis s/p I&D -no local symptoms, but was able to drain large amount of serous fluid from a tiny wound in his shoulder -will check MRI R shoulder -? Drug fever, has PICC line, denies IVDU,  -Blood Cx-NGTD -appreciate ID consult per Dr.Hatcher  R shoulder septic arthritis -Abx -Vanc and Merropenem now -see above, FU MRI  Tobacco abuse: -ounseled about importance of quitting smoking -Nicotine patch  DVT ppx: lovenox Code Status: Full code Family Communication: no family at bed side Disposition Plan:  Home pending workup  Consultants:   ID Dr.Hatcher  Antimicrobials:Vanc/Merrem 12/1  Subjective: Fever down temp 100.3, draining large amounts of fluid from tiny wound on R shoulder  Objective: Vitals:   01/26/16 0800 01/26/16 1300 01/26/16 2031 01/27/16 0520  BP: (!) 87/53 110/75 118/65 (!) 116/49  Pulse: 85 (!) 103 82 (!) 104  Resp: 18 18 18 20   Temp: 98 F (36.7 C) (!) 101.5 F (38.6 C) 98.1 F (36.7 C) 100.3 F (37.9 C)  TempSrc: Oral Oral Oral Oral  SpO2: 100% 100% 100%     Intake/Output Summary (Last 24 hours) at 01/27/16 1033 Last data filed at 01/27/16 0757  Gross per 24 hour  Intake             1770 ml  Output                0 ml  Net             1770 ml   There were no vitals filed for this visit.  Examination:  General exam: Appears  calm and comfortable, AAOx3, no distress Respiratory system: Clear to auscultation. Respiratory effort normal. Cardiovascular system: S1 & S2 heard, RRR. No JVD, murmurs, rubs, gallops or clicks. No pedal edema. Gastrointestinal system: Abdomen is nondistended, soft and nontender. No organomegaly or masses felt. Normal bowel sounds heard. Central nervous system: Alert and oriented. No focal neurological deficits. Extremities:R shoulder with healed surgical scar, no swelling or tenderness anywhere, drainage noted-serous with minimal purulence Skin: flushed Psychiatry: Judgement and insight appear normal. Mood & affect appropriate.     Data Reviewed: I have personally reviewed following labs and imaging studies  CBC:  Recent Labs Lab 01/25/16 2045 01/26/16 0205 01/27/16 0509  WBC 5.4 4.3 3.1*  NEUTROABS 4.2  --   --   HGB 12.6* 12.1* 10.6*  HCT 36.3* 35.8* 31.1*  MCV 83.1 83.6 82.7  PLT 257 227 184   Basic Metabolic Panel:  Recent Labs Lab 01/25/16 2045 01/26/16 0205 01/27/16 0509  NA 135 136 133*  K 3.5 4.0 3.3*  CL 98* 100* 101  CO2 24 25 24   GLUCOSE 109* 123* 99  BUN 8 8 6   CREATININE 1.09 1.14 0.94  CALCIUM 8.6* 8.4* 7.9*   GFR: Estimated Creatinine Clearance: 155.5 mL/min (by C-G formula based on SCr of 0.94 mg/dL). Liver Function Tests:  Recent Labs Lab 01/25/16 2045  01/27/16 0509  AST 45* 122*  ALT 55 140*  ALKPHOS 76 72  BILITOT 0.5 0.4  PROT 6.7 5.3*  ALBUMIN 3.1* 2.4*   No results for input(s): LIPASE, AMYLASE in the last 168 hours. No results for input(s): AMMONIA in the last 168 hours. Coagulation Profile:  Recent Labs Lab 01/25/16 2045  INR 1.25   Cardiac Enzymes: No results for input(s): CKTOTAL, CKMB, CKMBINDEX, TROPONINI in the last 168 hours. BNP (last 3 results) No results for input(s): PROBNP in the last 8760 hours. HbA1C: No results for input(s): HGBA1C in the last 72 hours. CBG: No results for input(s): GLUCAP in the last  168 hours. Lipid Profile: No results for input(s): CHOL, HDL, LDLCALC, TRIG, CHOLHDL, LDLDIRECT in the last 72 hours. Thyroid Function Tests: No results for input(s): TSH, T4TOTAL, FREET4, T3FREE, THYROIDAB in the last 72 hours. Anemia Panel: No results for input(s): VITAMINB12, FOLATE, FERRITIN, TIBC, IRON, RETICCTPCT in the last 72 hours. Urine analysis:    Component Value Date/Time   COLORURINE YELLOW 01/25/2016 2048   APPEARANCEUR CLEAR 01/25/2016 2048   LABSPEC 1.019 01/25/2016 2048   PHURINE 7.5 01/25/2016 2048   GLUCOSEU NEGATIVE 01/25/2016 2048   HGBUR NEGATIVE 01/25/2016 2048   BILIRUBINUR NEGATIVE 01/25/2016 2048   KETONESUR NEGATIVE 01/25/2016 2048   PROTEINUR NEGATIVE 01/25/2016 2048   NITRITE NEGATIVE 01/25/2016 2048   LEUKOCYTESUR NEGATIVE 01/25/2016 2048   Sepsis Labs: @LABRCNTIP (procalcitonin:4,lacticidven:4)  ) Recent Results (from the past 240 hour(s))  Culture, blood (Routine x 2)     Status: None (Preliminary result)   Collection Time: 01/25/16  8:35 PM  Result Value Ref Range Status   Specimen Description BLOOD RIGHT ARM  Final   Special Requests BOTTLES DRAWN AEROBIC AND ANAEROBIC 5CC  Final   Culture NO GROWTH < 24 HOURS  Final   Report Status PENDING  Incomplete  Culture, blood (Routine x 2)     Status: None (Preliminary result)   Collection Time: 01/25/16  8:40 PM  Result Value Ref Range Status   Specimen Description BLOOD RIGHT HAND  Final   Special Requests IN PEDIATRIC BOTTLE 4CC  Final   Culture NO GROWTH < 24 HOURS  Final   Report Status PENDING  Incomplete  Urine culture     Status: None   Collection Time: 01/25/16  8:48 PM  Result Value Ref Range Status   Specimen Description URINE, RANDOM  Final   Special Requests NONE  Final   Culture NO GROWTH  Final   Report Status 01/27/2016 FINAL  Final         Radiology Studies: Dg Chest 2 View  Result Date: 01/25/2016 CLINICAL DATA:  Acute onset of fever and tachycardia. Generalized  weakness. Red rash over the right shoulder. Initial encounter. EXAM: CHEST  2 VIEW COMPARISON:  Chest radiograph performed 09/25/2007 FINDINGS: A left PICC is noted ending overlying the mid SVC. The lungs are well-aerated. Pulmonary vascularity is at the upper limits of normal. There is no evidence of focal opacification, pleural effusion or pneumothorax. The heart is normal in size; the mediastinal contour is within normal limits. No acute osseous abnormalities are seen. Postoperative change is noted at the right glenoid, with mild chronic deformity. IMPRESSION: No acute cardiopulmonary process seen. Electronically Signed   By: Roanna RaiderJeffery  Chang M.D.   On: 01/25/2016 22:29        Scheduled Meds: . enoxaparin (LOVENOX) injection  40 mg Subcutaneous Q24H  . [START ON 01/28/2016] Influenza vac split quadrivalent PF  0.5 mL Intramuscular Tomorrow-1000  . meropenem (MERREM) IV  1 g Intravenous Q8H  . nicotine  21 mg Transdermal Daily  . sodium chloride flush  3 mL Intravenous Q12H  . vancomycin  1,250 mg Intravenous Q8H   Continuous Infusions: . sodium chloride 75 mL/hr at 01/27/16 0520     LOS: 1 day    Time spent:    Zannie Cove, MD [facility] Pager [phone]  If 7PM-7AM, please contact night-coverage [email or web address] Password [email or web address] 01/27/2016, 10:33 AM

## 2016-01-27 NOTE — Progress Notes (Signed)
Pharmacy Antibiotic Note  Ryan IpChristian B Tretter is a 25 y.o. male admitted on 01/25/2016 with fever, possible HCAP. Noted pt with recent admission for septic joint. Discharged home on Rocephin and Vancomycin. Plan was to continue for 6 weeks (end date of 02/26/16) per ID. Pt now presents also with rash - suspected due to Rocephin. Pharmacy has been consulted for vancomycin and meropenem dosing. MRI R shoulder pending, draining large amounts of fluid from shoulder wound.  Tmax/24h 101.5, WBC low 3.1. SCr down 0.94, CrCl>100.  Vancomycin trough slightly low (14) on 1250mg  IV q8h, drawn ~30 minute early.  Plan: Increase vancomycin to 1500mg  IV Q8h Meropenem 1gm IV q8h Monitor clinical picture, renal function, and micro data Check VT at new Css F/u MRI shoulder results    Temp (24hrs), Avg:99.8 F (37.7 C), Min:98.1 F (36.7 C), Max:101 F (38.3 C)   Recent Labs Lab 01/25/16 2045 01/25/16 2102 01/25/16 2350 01/26/16 0205 01/26/16 0540 01/27/16 0509 01/27/16 1459  WBC 5.4  --   --  4.3  --  3.1*  --   CREATININE 1.09  --   --  1.14  --  0.94  --   LATICACIDVEN  --  2.06* 0.52 1.5 0.7  --   --   VANCOTROUGH  --   --   --   --   --   --  14*    Estimated Creatinine Clearance: 155.5 mL/min (by C-G formula based on SCr of 0.94 mg/dL).    No Known Allergies  Babs BertinHaley Jnae Thomaston, PharmD, BCPS Clinical Pharmacist 01/27/2016 5:07 PM

## 2016-01-27 NOTE — Progress Notes (Signed)
Patient up in room going to bathroom and H.R. up S.T. 120-140 ' s with activity.

## 2016-01-27 NOTE — Progress Notes (Signed)
INFECTIOUS DISEASE PROGRESS NOTE  ID: Ryan Guerra is a 25 y.o. male with  Principal Problem:   Sepsis ([place]) Active Problems:   Pyogenic arthritis of right shoulder region ([place])   Rash   Tobacco abuse   Fever  Subjective: Tm 100.3 Feels better   Abtx:  Anti-infectives    Start     Dose/Rate Route Frequency Ordered Stop   01/26/16 0800  vancomycin (VANCOCIN) 1,250 mg in sodium chloride 0.9 % 250 mL IVPB     1,250 mg 166.7 mL/hr over 90 Minutes Intravenous Every 8 hours 01/26/16 0206     01/26/16 0600  ceFEPIme (MAXIPIME) 2 g in dextrose 5 % 50 mL IVPB  Status:  Discontinued     2 g 100 mL/hr over 30 Minutes Intravenous Every 8 hours 01/26/16 0206 01/26/16 0210   01/26/16 0230  meropenem (MERREM) 1 g in sodium chloride 0.9 % 100 mL IVPB     1 g 200 mL/hr over 30 Minutes Intravenous Every 8 hours 01/26/16 0213     01/26/16 0200  vancomycin IVPB  Status:  Discontinued    Comments:  Indication:  Septic joint  Last Day of Therapy: 02/28/16 Labs - Sunday/Monday:  CBC/D, BMP, and vancomycin trough. Labs - Thursday:  BMP and vancomycin trough Labs - Every other week:  ESR and CRP     1,250 mg Intravenous Every 8 hours 01/26/16 0152 01/26/16 0204   01/26/16 0200  ceFEPIme (MAXIPIME) 2 g in dextrose 5 % 50 mL IVPB  Status:  Discontinued     2 g 100 mL/hr over 30 Minutes Intravenous Every 24 hours 01/26/16 0159 01/26/16 0204   01/25/16 2215  vancomycin (VANCOCIN) 1,250 mg in sodium chloride 0.9 % 250 mL IVPB     1,250 mg 166.7 mL/hr over 90 Minutes Intravenous  Once 01/25/16 2212 01/26/16 0106   01/25/16 2215  ceFEPIme (MAXIPIME) 2 g in dextrose 5 % 50 mL IVPB     2 g 100 mL/hr over 30 Minutes Intravenous  Once 01/25/16 2212 01/25/16 2320      Medications:  Scheduled: . enoxaparin (LOVENOX) injection  40 mg Subcutaneous Q24H  . [START ON 01/28/2016] Influenza vac split quadrivalent PF  0.5 mL Intramuscular Tomorrow-1000  . meropenem (MERREM) IV  1 g Intravenous Q8H  .  nicotine  21 mg Transdermal Daily  . sodium chloride flush  3 mL Intravenous Q12H  . vancomycin  1,250 mg Intravenous Q8H    Objective: Vital signs in last 24 hours: Temp:  [98.1 F (36.7 C)-101.5 F (38.6 C)] 100.3 F (37.9 C) (12/03 0520) Pulse Rate:  [82-104] 104 (12/03 0520) Resp:  [18-20] 20 (12/03 0520) BP: (110-118)/(49-75) 116/49 (12/03 0520) SpO2:  [100 %] 100 % (12/02 2031)   General appearance: alert, cooperative and no distress Resp: clear to auscultation bilaterally Cardio: regular rate and rhythm GI: normal findings: bowel sounds normal and soft, non-tender Extremities: LUE PIC is clean.  Skin: erythema - generalized  He easily expresses serous fluid from his wound, it "squirts out" in a jet.   Lab Results  Recent Labs  01/26/16 0205 01/27/16 0509  WBC 4.3 3.1*  HGB 12.1* 10.6*  HCT 35.8* 31.1*  NA 136 133*  K 4.0 3.3*  CL 100* 101  CO2 25 24  BUN 8 6  CREATININE 1.14 0.94   Liver Panel  Recent Labs  01/25/16 2045 01/27/16 0509  PROT 6.7 5.3*  ALBUMIN 3.1* 2.4*  AST 45* 122*  ALT  55 140*  ALKPHOS 76 72  BILITOT 0.5 0.4   Sedimentation Rate  Recent Labs  01/26/16 0205  ESRSEDRATE 48*   C-Reactive Protein  Recent Labs  01/26/16 0205  CRP 12.3*    Microbiology: Recent Results (from the past 240 hour(s))  Culture, blood (Routine x 2)     Status: None (Preliminary result)   Collection Time: 01/25/16  8:35 PM  Result Value Ref Range Status   Specimen Description BLOOD RIGHT ARM  Final   Special Requests BOTTLES DRAWN AEROBIC AND ANAEROBIC 5CC  Final   Culture NO GROWTH < 24 HOURS  Final   Report Status PENDING  Incomplete  Culture, blood (Routine x 2)     Status: None (Preliminary result)   Collection Time: 01/25/16  8:40 PM  Result Value Ref Range Status   Specimen Description BLOOD RIGHT HAND  Final   Special Requests IN PEDIATRIC BOTTLE 4CC  Final   Culture NO GROWTH < 24 HOURS  Final   Report Status PENDING  Incomplete     Studies/Results: Dg Chest 2 View  Result Date: 01/25/2016 CLINICAL DATA:  Acute onset of fever and tachycardia. Generalized weakness. Red rash over the right shoulder. Initial encounter. EXAM: CHEST  2 VIEW COMPARISON:  Chest radiograph performed 09/25/2007 FINDINGS: A left PICC is noted ending overlying the mid SVC. The lungs are well-aerated. Pulmonary vascularity is at the upper limits of normal. There is no evidence of focal opacification, pleural effusion or pneumothorax. The heart is normal in size; the mediastinal contour is within normal limits. No acute osseous abnormalities are seen. Postoperative change is noted at the right glenoid, with mild chronic deformity. IMPRESSION: No acute cardiopulmonary process seen. Electronically Signed   By: Garald Balding M.D.   On: 01/25/2016 22:29     Assessment/Plan: Septic Arthritis with Hardware Adverse Drug Reaction, Rash  Total days of antibiotics: 11 (vanco, merrem)        Temps better BCx pending Rash slightly worse Would not pull PIC at this point.  If rash worsens, could change vanco to dapto. His continued expression of fluid from his wound is concerning.     Bobby Rumpf Infectious Diseases (pager) [phone] [email or web address] 01/27/2016, 9:46 AM  LOS: 1 day

## 2016-01-28 LAB — CK: CK TOTAL: 50 U/L (ref 49–397)

## 2016-01-28 LAB — COMPREHENSIVE METABOLIC PANEL
ALBUMIN: 2.7 g/dL — AB (ref 3.5–5.0)
ALK PHOS: 93 U/L (ref 38–126)
ALT: 212 U/L — ABNORMAL HIGH (ref 17–63)
ANION GAP: 9 (ref 5–15)
AST: 167 U/L — AB (ref 15–41)
BUN: 6 mg/dL (ref 6–20)
CALCIUM: 8.2 mg/dL — AB (ref 8.9–10.3)
CO2: 26 mmol/L (ref 22–32)
Chloride: 101 mmol/L (ref 101–111)
Creatinine, Ser: 0.8 mg/dL (ref 0.61–1.24)
GFR calc Af Amer: >60 mL/min (ref >60)
GFR calc non Af Amer: >60 mL/min (ref >60)
GLUCOSE: 101 mg/dL — AB (ref 65–99)
POTASSIUM: 3.7 mmol/L (ref 3.5–5.1)
SODIUM: 136 mmol/L (ref 135–145)
Total Bilirubin: 0.4 mg/dL (ref 0.3–1.2)
Total Protein: 5.7 g/dL — ABNORMAL LOW (ref 6.5–8.1)

## 2016-01-28 LAB — CBC
HCT: 30.8 % — ABNORMAL LOW (ref 39.0–52.0)
HEMOGLOBIN: 10.7 g/dL — AB (ref 13.0–17.0)
MCH: 28.7 pg (ref 26.0–34.0)
MCHC: 34.7 g/dL (ref 30.0–36.0)
MCV: 82.6 fL (ref 78.0–100.0)
Platelets: 154 10*3/uL (ref 150–400)
RBC: 3.73 MIL/uL — ABNORMAL LOW (ref 4.22–5.81)
RDW: 11.6 % (ref 11.5–15.5)
WBC: 2.9 10*3/uL — AB (ref 4.0–10.5)

## 2016-01-28 LAB — GLUCOSE, CAPILLARY
GLUCOSE-CAPILLARY: 89 mg/dL (ref 65–99)
GLUCOSE-CAPILLARY: 99 mg/dL (ref 65–99)

## 2016-01-28 MED ORDER — SODIUM CHLORIDE 0.9 % IV SOLN
6.0000 mg/kg | INTRAVENOUS | Status: DC
  Administered 2016-01-28: 544 mg via INTRAVENOUS
  Filled 2016-01-28 (×3): qty 10.88

## 2016-01-28 MED ORDER — OXYCODONE HCL 5 MG PO TABS
10.0000 mg | ORAL_TABLET | ORAL | Status: DC | PRN
  Administered 2016-01-28 (×2): 10 mg via ORAL
  Filled 2016-01-28 (×2): qty 2

## 2016-01-28 MED ORDER — DIPHENHYDRAMINE HCL 25 MG PO CAPS
25.0000 mg | ORAL_CAPSULE | ORAL | Status: DC | PRN
  Administered 2016-01-28 (×2): 25 mg via ORAL
  Filled 2016-01-28 (×2): qty 1

## 2016-01-28 NOTE — Care Management Note (Signed)
Case Management Note Donn PieriniKristi Ernest Orr RN, BSN Unit 2W-Case Manager [phone]  Patient Details  Name: Raliegh IpChristian B Schiele MRN: [id number] Date of Birth: 06-07-1990  Subjective/Objective:  Pt admitted with sepsis, hx of recent pyogenic arthritis of right shoulder region s/p I&D with home IV abx               Action/Plan: PTA pt lived [facility]- Pt will need resumption orders for discharge if to resume home IV abx at discharge. CM to follow  Expected Discharge Date:                  Expected Discharge Plan:  Home w Home Health Services  In-House Referral:     Discharge planning Services  CM Consult  Post Acute Care Choice:  Home Health, Resumption of Svcs/PTA Provider Choice offered to:  Patient  DME Arranged:    DME Agency:     HH Arranged:  RN, Nurse's Aide, IV Antibiotics HH Agency:  [facility]  Status of Service:  In process, will continue to follow  If discussed at Long Length of Stay Meetings, dates discussed:    Additional Comments:  Darrold SpanWebster, Jennette Leask Hall, RN 01/28/2016, 1:51 PM

## 2016-01-28 NOTE — Progress Notes (Signed)
Patient cont. To have red rash on chest abdo back and arms. More itching tonight than the previous night. Text page K.Schorr N.P. awaiting return call R.N. aware

## 2016-01-28 NOTE — Progress Notes (Signed)
PROGRESS NOTE    Ryan IpChristian B Guerra  RUE:[id number] DOB: 1990/07/26 DOA: 01/25/2016 PCP: Jackie PlumSEI-BONSU,GEORGE, MD  Brief Narrative: 25 y.o.malewho underwent an open coracoid transfer shoulder surgery for dislocation on 12/31/2015.  Then developed post op infection and septic arthritis and had I&D on 11-22. Cultures negative, He was d/c home on 11-25 with vancomycin and ceftriaxone.  Then had some persistent pain and swelling in his shoulder, ceftriaxone was changed to cefepime on 11-28. He returned to hospital on 12-1 with fever 102.2 and chills, R shoulder feels fine  Assessment & Plan:  Fever -recent septic arthritis s/p I&D -no local symptoms, but was able to drain large amount of serous fluid from a tiny wound in his shoulder -MRI R shoulder without abscess scant amount of fluid noted anterior to deltoid on MRI which is draining spontaneously -Drug fever suspected, has PICC line, denies IVDU,  -Blood Cx-NGTD -appreciate ID consult per Dr.Hatcher -Vanc changed to Daptomycin today   Rising LFTS -check Acute hepatitis panel -no abd symptoms -could likely due to drug reaction  R shoulder septic arthritis -Abx -Vanc and Merropenem now -see above,  MRI noted -due to continued drainage from R shoulder left msg for Dr.Chandler for his input  Tobacco abuse: -counseled , Nicotine patch  DVT ppx: lovenox Code Status: Full code Family Communication: no family at bed side Disposition Plan:  Home pending workup  Consultants:   ID Dr.Hatcher  Antimicrobials:Vanc/Merrem 12/1 Changed to Dapto and merrem 12/4  Subjective: Temp of 101.8 this am  Objective: Vitals:   01/27/16 2045 01/28/16 0035 01/28/16 0400 01/28/16 0500  BP: 110/63  (!) 108/59   Pulse: 71     Resp: 18  20   Temp: 97.6 F (36.4 C) (!) 101.8 F (38.8 C) 98.3 F (36.8 C)   TempSrc: Oral Oral Oral   SpO2: 99%  97%   Height:    6\' 7"  (2.007 m)    Intake/Output Summary (Last 24 hours) at 01/28/16  1058 Last data filed at 01/28/16 0559  Gross per 24 hour  Intake             2274 ml  Output                0 ml  Net             2274 ml   There were no vitals filed for this visit.  Examination:  General exam: Appears calm and comfortable, AAOx3, no distress Respiratory system: Clear to auscultation. Respiratory effort normal. Cardiovascular system: S1 & S2 heard, RRR. No JVD, murmurs, rubs, gallops or clicks. No pedal edema. Gastrointestinal system: Abdomen is nondistended, soft and nontender. No organomegaly or masses felt. Normal bowel sounds heard. Central nervous system: Alert and oriented. No focal neurological deficits. Extremities:R shoulder with healed surgical scar, no swelling or tenderness anywhere, drainage noted-serous with minimal purulence Skin: flushed, diffuse erythematous rash Psychiatry: Judgement and insight appear normal. Mood & affect appropriate.     Data Reviewed: I have personally reviewed following labs and imaging studies  CBC:  Recent Labs Lab 01/25/16 2045 01/26/16 0205 01/27/16 0509 01/28/16 0500  WBC 5.4 4.3 3.1* 2.9*  NEUTROABS 4.2  --   --   --   HGB 12.6* 12.1* 10.6* 10.7*  HCT 36.3* 35.8* 31.1* 30.8*  MCV 83.1 83.6 82.7 82.6  PLT 257 227 184 154   Basic Metabolic Panel:  Recent Labs Lab 01/25/16 2045 01/26/16 0205 01/27/16 0509 01/28/16 0500  NA 135 136 133*  136  K 3.5 4.0 3.3* 3.7  CL 98* 100* 101 101  CO2 24 25 24 26   GLUCOSE 109* 123* 99 101*  BUN 8 8 6 6   CREATININE 1.09 1.14 0.94 0.80  CALCIUM 8.6* 8.4* 7.9* 8.2*   GFR: Estimated Creatinine Clearance: 182.7 mL/min (by C-G formula based on SCr of 0.8 mg/dL). Liver Function Tests:  Recent Labs Lab 01/25/16 2045 01/27/16 0509 01/28/16 0500  AST 45* 122* 167*  ALT 55 140* 212*  ALKPHOS 76 72 93  BILITOT 0.5 0.4 0.4  PROT 6.7 5.3* 5.7*  ALBUMIN 3.1* 2.4* 2.7*   No results for input(s): LIPASE, AMYLASE in the last 168 hours. No results for input(s): AMMONIA  in the last 168 hours. Coagulation Profile:  Recent Labs Lab 01/25/16 2045  INR 1.25   Cardiac Enzymes: No results for input(s): CKTOTAL, CKMB, CKMBINDEX, TROPONINI in the last 168 hours. BNP (last 3 results) No results for input(s): PROBNP in the last 8760 hours. HbA1C: No results for input(s): HGBA1C in the last 72 hours. CBG:  Recent Labs Lab 01/28/16 0623  GLUCAP 89   Lipid Profile: No results for input(s): CHOL, HDL, LDLCALC, TRIG, CHOLHDL, LDLDIRECT in the last 72 hours. Thyroid Function Tests: No results for input(s): TSH, T4TOTAL, FREET4, T3FREE, THYROIDAB in the last 72 hours. Anemia Panel: No results for input(s): VITAMINB12, FOLATE, FERRITIN, TIBC, IRON, RETICCTPCT in the last 72 hours. Urine analysis:    Component Value Date/Time   COLORURINE YELLOW 01/25/2016 2048   APPEARANCEUR CLEAR 01/25/2016 2048   LABSPEC 1.019 01/25/2016 2048   PHURINE 7.5 01/25/2016 2048   GLUCOSEU NEGATIVE 01/25/2016 2048   HGBUR NEGATIVE 01/25/2016 2048   BILIRUBINUR NEGATIVE 01/25/2016 2048   KETONESUR NEGATIVE 01/25/2016 2048   PROTEINUR NEGATIVE 01/25/2016 2048   NITRITE NEGATIVE 01/25/2016 2048   LEUKOCYTESUR NEGATIVE 01/25/2016 2048   Sepsis Labs: @LABRCNTIP (procalcitonin:4,lacticidven:4)  ) Recent Results (from the past 240 hour(s))  Culture, blood (Routine x 2)     Status: None (Preliminary result)   Collection Time: 01/25/16  8:35 PM  Result Value Ref Range Status   Specimen Description BLOOD RIGHT ARM  Final   Special Requests BOTTLES DRAWN AEROBIC AND ANAEROBIC 5CC  Final   Culture NO GROWTH 2 DAYS  Final   Report Status PENDING  Incomplete  Culture, blood (Routine x 2)     Status: None (Preliminary result)   Collection Time: 01/25/16  8:40 PM  Result Value Ref Range Status   Specimen Description BLOOD RIGHT HAND  Final   Special Requests IN PEDIATRIC BOTTLE 4CC  Final   Culture NO GROWTH 2 DAYS  Final   Report Status PENDING  Incomplete  Urine culture      Status: None   Collection Time: 01/25/16  8:48 PM  Result Value Ref Range Status   Specimen Description URINE, RANDOM  Final   Special Requests NONE  Final   Culture NO GROWTH  Final   Report Status 01/27/2016 FINAL  Final         Radiology Studies: Mr Shoulder Right W Wo Contrast  Result Date: 01/27/2016 CLINICAL DATA:  25 year old status post open coracoid transfer surgery for dislocation 12/31/2015 complicated by postoperative infection and septic arthritis requiring irrigation and drainage 01/16/2016. Subsequent antibiotics. Persistent pain, swelling and fever. EXAM: MRI OF THE RIGHT SHOULDER WITHOUT AND WITH CONTRAST TECHNIQUE: Multiplanar, multisequence MR imaging of the right shoulder shoulder was performed before and after the administration of intravenous contrast. CONTRAST:  15mL MULTIHANCE GADOBENATE  DIMEGLUMINE 529 MG/ML IV SOLN COMPARISON:  Preoperative CT 09/20/2015. Intraoperative radiographs 12/31/2015. FINDINGS: There is moderate susceptibility artifact related to the 2 anchor screws posteriorly in the glenoid. Protocol was adjusted to minimize this artifact. Rotator cuff:  Intact. Muscles: Presumed postsurgical changes anteriorly within the deltoid and subscapularis muscles with heterogeneous T2 signal, small fluid collections and diffuse enhancement. The additional components of the rotator cuff appear normal. Biceps long head:  Intact and normally positioned. Acromioclavicular Joint: Normal. Type 1 acromion. No significant subacromial bursal fluid. Glenohumeral Joint: Marked synovial thickening with diffuse enhancement following contrast. There is relatively little joint fluid on the post-contrast images, mostly in the axillary recess (image 17 of series 15). Synovial enhancement extends into the bicipital groove. Labrum: Partly obscured by artifact posteriorly and inferiorly. No evidence labral tear. Bones: Postsurgical changes related to open coracoid transfer and inferior  glenoid reconstruction. Marrow assessment limited by artifact. There is no worrisome marrow signal or enhancement. There is no cortical destruction. Chronic Hill-Sachs deformity noted. Other: There is a track of peripherally enhancing fluid extending anteriorly into the deltoid muscle, measuring up to 4.0 x 1.1 cm on image 17 of series 15. IMPRESSION: 1. Severe synovitis with marked synovial thickening and fairly homogeneous enhancement following contrast. There is minimal joint fluid. 2. Small amount of fluid extending anteriorly into the deltoid muscle, presumed postsurgical. No other periarticular fluid collections. Potential anterior myositis. 3. Postsurgical changes from inferior glenoid reconstruction. No evidence of osteomyelitis. Electronically Signed   By: Carey Bullocks M.D.   On: 01/27/2016 15:10        Scheduled Meds: . enoxaparin (LOVENOX) injection  40 mg Subcutaneous Q24H  . Influenza vac split quadrivalent PF  0.5 mL Intramuscular Tomorrow-1000  . meropenem (MERREM) IV  1 g Intravenous Q8H  . nicotine  21 mg Transdermal Daily  . sodium chloride flush  3 mL Intravenous Q12H   Continuous Infusions: . sodium chloride 75 mL/hr at 01/28/16 0854     LOS: 2 days    Time spent:    Zannie Cove, MD [facility] Pager [phone]  If 7PM-7AM, please contact night-coverage [email or web address] Password [email or web address] 01/28/2016, 10:58 AM

## 2016-01-28 NOTE — Progress Notes (Signed)
Late entry:  Pharmacy Antibiotic Note  Ryan Guerra is a 25 y.o. male admitted on 01/25/2016 with septic arthritis.  Pharmacy has been consulted for Daptomycin dosing.  Continued temps with leukopenia and not feeling well, changing from Vancomycin to Daptomycin and continuing Meropenem.  Plan: Daptomycin 6mg /kg IV q24 Continue Meropenem 1g IV q8 F/U culture results  Height: 6\' 7"  (200.7 cm) IBW/kg (Calculated) : 93.7  Temp (24hrs), Avg:99.2 F (37.3 C), Min:97.6 F (36.4 C), Max:101.8 F (38.8 C)   Recent Labs Lab 01/25/16 2045 01/25/16 2102 01/25/16 2350 01/26/16 0205 01/26/16 0540 01/27/16 0509 01/27/16 1459 01/28/16 0500  WBC 5.4  --   --  4.3  --  3.1*  --  2.9*  CREATININE 1.09  --   --  1.14  --  0.94  --  0.80  LATICACIDVEN  --  2.06* 0.52 1.5 0.7  --   --   --   VANCOTROUGH  --   --   --   --   --   --  14*  --     Estimated Creatinine Clearance: 182.7 mL/min (by C-G formula based on SCr of 0.8 mg/dL).    No Known Allergies  Antimicrobials:  Daptomycin 12/4 >> Vancomycin 11/22 >> 12/4 Meropenem 12/2>> (02/26/16) Rocephin 11/24 >>12/1 Cefepime x 1 12/1  Dose adjustments:  11/25 VT = 9 (On 1.5g Q12; increase dose to 1,250mg  Q8)  Microbiology results:  11/22 Deep Wound Cx: negative 12/1 Bld cx x2: ngtd 12/1 Urine cx: neg 12/2 Flu: neg  Thank you for allowing pharmacy to be a part of this patient's care.  Marisue HumbleKendra Samuel Rittenhouse, PharmD Clinical Pharmacist [facility]

## 2016-01-28 NOTE — Progress Notes (Signed)
   PATIENT ID: Ryan Guerra       Subjective: [facility]2017 for hypotension, fever, chills and weakness with new rash. Treated for acute sepsis vs. Medication adverse reaction. S/p right coracoid transfer 01/10/16 with postoperative infection, treated with IV abx and followed by Dr. Algis LimingVandam of [facility]. Was last seen in our office Friday 01/25/16 mid day with resolving drainage and improving infection with no global symptoms of infection. The patient tells us later that afternoon he had generalized weakness, dizziness, aches and fever. He tells me he thinks it is related to his medication: the antibiotics or narcotics.   Objective:  Vitals:   01/28/16 0035 01/28/16 0400  BP:  (!) 108/59  Pulse:    Resp:  20  Temp: (!) 101.8 F (38.8 C) 98.3 F (36.8 C)     R shoulder healing incision and drain sites No erythema, warmth or effusion Mild rash right torso, chest  Labs:   Recent Labs  01/25/16 2045 01/26/16 0205 01/27/16 0509 01/28/16 0500  HGB 12.6* 12.1* 10.6* 10.7*   Recent Labs  01/27/16 0509 01/28/16 0500  WBC 3.1* 2.9*  RBC 3.76* 3.73*  HCT 31.1* 30.8*  PLT 184 154   Recent Labs  01/27/16 0509 01/28/16 0500  NA 133* 136  K 3.3* 3.7  CL 101 101  CO2 24 26  BUN 6 6  CREATININE 0.94 0.80  GLUCOSE 99 101*  CALCIUM 7.9* 8.2*    Assessment and Plan: His right shoulder looks great today, no drainage, effusion, skin changes. It continues to look like his infection is improving and responding to the antibiotics. We would not recommend further surgical intervention and will continue to monitor site Most recent symptoms less likely secondary to shoulder infection and more likely a response to medication, either the 2 IV abx or possibly withdrawal symptoms from narcotics He has a hx of narcotic abuse but has been minimizing narcotics and taking them as prescribed with no evidence of recent intentional abuse ID is changing abx  regimen We appreciate medicine and infectious disease help and care for this patient and we will continue to monitor and follow along  He is scheduled to return to Dr. Ave Filterhandler 02/22/16

## 2016-01-28 NOTE — Progress Notes (Signed)
INFECTIOUS DISEASE PROGRESS NOTE  ID: Ryan Guerra is a 25 y.o. male with  Principal Problem:   Sepsis ([place]) Active Problems:   Pyogenic arthritis of right shoulder region ([place])   Rash   Tobacco abuse   Fever  Subjective: Feels poorly. Rash worse.  Sore tongue.? SOB.   Abtx:  Anti-infectives    Start     Dose/Rate Route Frequency Ordered Stop   01/27/16 1730  vancomycin (VANCOCIN) 1,500 mg in sodium chloride 0.9 % 500 mL IVPB     1,500 mg 250 mL/hr over 120 Minutes Intravenous Every 8 hours 01/27/16 1710     01/26/16 0800  vancomycin (VANCOCIN) 1,250 mg in sodium chloride 0.9 % 250 mL IVPB  Status:  Discontinued     1,250 mg 166.7 mL/hr over 90 Minutes Intravenous Every 8 hours 01/26/16 0206 01/27/16 1710   01/26/16 0600  ceFEPIme (MAXIPIME) 2 g in dextrose 5 % 50 mL IVPB  Status:  Discontinued     2 g 100 mL/hr over 30 Minutes Intravenous Every 8 hours 01/26/16 0206 01/26/16 0210   01/26/16 0230  meropenem (MERREM) 1 g in sodium chloride 0.9 % 100 mL IVPB     1 g 200 mL/hr over 30 Minutes Intravenous Every 8 hours 01/26/16 0213     01/26/16 0200  vancomycin IVPB  Status:  Discontinued    Comments:  Indication:  Septic joint  Last Day of Therapy: 02/28/16 Labs - Sunday/Monday:  CBC/D, BMP, and vancomycin trough. Labs - Thursday:  BMP and vancomycin trough Labs - Every other week:  ESR and CRP     1,250 mg Intravenous Every 8 hours 01/26/16 0152 01/26/16 0204   01/26/16 0200  ceFEPIme (MAXIPIME) 2 g in dextrose 5 % 50 mL IVPB  Status:  Discontinued     2 g 100 mL/hr over 30 Minutes Intravenous Every 24 hours 01/26/16 0159 01/26/16 0204   01/25/16 2215  vancomycin (VANCOCIN) 1,250 mg in sodium chloride 0.9 % 250 mL IVPB     1,250 mg 166.7 mL/hr over 90 Minutes Intravenous  Once 01/25/16 2212 01/26/16 0106   01/25/16 2215  ceFEPIme (MAXIPIME) 2 g in dextrose 5 % 50 mL IVPB     2 g 100 mL/hr over 30 Minutes Intravenous  Once 01/25/16 2212 01/25/16 2320       Medications:  Scheduled: . enoxaparin (LOVENOX) injection  40 mg Subcutaneous Q24H  . Influenza vac split quadrivalent PF  0.5 mL Intramuscular Tomorrow-1000  . meropenem (MERREM) IV  1 g Intravenous Q8H  . nicotine  21 mg Transdermal Daily  . sodium chloride flush  3 mL Intravenous Q12H  . vancomycin  1,500 mg Intravenous Q8H    Objective: Vital signs in last 24 hours: Temp:  [97.6 F (36.4 C)-101.8 F (38.8 C)] 98.3 F (36.8 C) (12/04 0400) Pulse Rate:  [71-97] 71 (12/03 2045) Resp:  [18-20] 20 (12/04 0400) BP: (108-121)/(59-67) 108/59 (12/04 0400) SpO2:  [97 %-99 %] 97 % (12/04 0400)   General appearance: alert, cooperative, flushed and no distress Throat: normal findings: oropharynx pink & moist without lesions or evidence of thrush and no ulcers Resp: clear to auscultation bilaterally Cardio: regular rate and rhythm Extremities: R shoulder mild edema. no d/ cexpressed from wound.   Lab Results  Recent Labs  01/27/16 0509 01/28/16 0500  WBC 3.1* 2.9*  HGB 10.6* 10.7*  HCT 31.1* 30.8*  NA 133* 136  K 3.3* 3.7  CL 101 101  CO2 24  26  BUN 6 6  CREATININE 0.94 0.80   Liver Panel  Recent Labs  01/27/16 0509 01/28/16 0500  PROT 5.3* 5.7*  ALBUMIN 2.4* 2.7*  AST 122* 167*  ALT 140* 212*  ALKPHOS 72 93  BILITOT 0.4 0.4   Sedimentation Rate  Recent Labs  01/26/16 0205  ESRSEDRATE 48*   C-Reactive Protein  Recent Labs  01/26/16 0205  CRP 12.3*    Microbiology: Recent Results (from the past 240 hour(s))  Culture, blood (Routine x 2)     Status: None (Preliminary result)   Collection Time: 01/25/16  8:35 PM  Result Value Ref Range Status   Specimen Description BLOOD RIGHT ARM  Final   Special Requests BOTTLES DRAWN AEROBIC AND ANAEROBIC 5CC  Final   Culture NO GROWTH 2 DAYS  Final   Report Status PENDING  Incomplete  Culture, blood (Routine x 2)     Status: None (Preliminary result)   Collection Time: 01/25/16  8:40 PM  Result Value Ref  Range Status   Specimen Description BLOOD RIGHT HAND  Final   Special Requests IN PEDIATRIC BOTTLE 4CC  Final   Culture NO GROWTH 2 DAYS  Final   Report Status PENDING  Incomplete  Urine culture     Status: None   Collection Time: 01/25/16  8:48 PM  Result Value Ref Range Status   Specimen Description URINE, RANDOM  Final   Special Requests NONE  Final   Culture NO GROWTH  Final   Report Status 01/27/2016 FINAL  Final    Studies/Results: Mr Shoulder Right W Wo Contrast  Result Date: 01/27/2016 CLINICAL DATA:  25 year old status post open coracoid transfer surgery for dislocation 16/02/930 complicated by postoperative infection and septic arthritis requiring irrigation and drainage 01/16/2016. Subsequent antibiotics. Persistent pain, swelling and fever. EXAM: MRI OF THE RIGHT SHOULDER WITHOUT AND WITH CONTRAST TECHNIQUE: Multiplanar, multisequence MR imaging of the right shoulder shoulder was performed before and after the administration of intravenous contrast. CONTRAST:  75m MULTIHANCE GADOBENATE DIMEGLUMINE 529 MG/ML IV SOLN COMPARISON:  Preoperative CT 09/20/2015. Intraoperative radiographs 12/31/2015. FINDINGS: There is moderate susceptibility artifact related to the 2 anchor screws posteriorly in the glenoid. Protocol was adjusted to minimize this artifact. Rotator cuff:  Intact. Muscles: Presumed postsurgical changes anteriorly within the deltoid and subscapularis muscles with heterogeneous T2 signal, small fluid collections and diffuse enhancement. The additional components of the rotator cuff appear normal. Biceps long head:  Intact and normally positioned. Acromioclavicular Joint: Normal. Type 1 acromion. No significant subacromial bursal fluid. Glenohumeral Joint: Marked synovial thickening with diffuse enhancement following contrast. There is relatively little joint fluid on the post-contrast images, mostly in the axillary recess (image 17 of series 15). Synovial enhancement extends  into the bicipital groove. Labrum: Partly obscured by artifact posteriorly and inferiorly. No evidence labral tear. Bones: Postsurgical changes related to open coracoid transfer and inferior glenoid reconstruction. Marrow assessment limited by artifact. There is no worrisome marrow signal or enhancement. There is no cortical destruction. Chronic Hill-Sachs deformity noted. Other: There is a track of peripherally enhancing fluid extending anteriorly into the deltoid muscle, measuring up to 4.0 x 1.1 cm on image 17 of series 15. IMPRESSION: 1. Severe synovitis with marked synovial thickening and fairly homogeneous enhancement following contrast. There is minimal joint fluid. 2. Small amount of fluid extending anteriorly into the deltoid muscle, presumed postsurgical. No other periarticular fluid collections. Potential anterior myositis. 3. Postsurgical changes from inferior glenoid reconstruction. No evidence of osteomyelitis. Electronically Signed  By: Richardean Sale M.D.   On: 01/27/2016 15:10     Assessment/Plan: Septic Arthritis with hardware Adverse Drug Reaction  Total days of antibiotics: 11 (vanco/merrem)  Will change his vanco to dapto given his low WBC and continued fever He needs ortho re-eval with his persistent wound drainage. Could consider trial of steroids if ok with ortho.           Bobby Rumpf Infectious Diseases (pager) [phone] [email or web address] 01/28/2016, 9:16 AM  LOS: 2 days

## 2016-01-28 NOTE — Progress Notes (Signed)
[facility]  [facility] to [facility]  [facility] providing [facility] and [facility] for [facility]X. [facility] during [facility] stay to support transition home when ordered.  If patient discharges after hours, please call [phone].   Sedalia Mutaamela S Chandler 01/28/2016, 1:24 PM

## 2016-01-28 NOTE — Progress Notes (Signed)
[facility]  Patient Status: Active (receiving services up to time of hospitalization)  [facility] is providing the following services: RN  If patient discharges after hours, please call [phone].   Avie EchevariaKaren Nussbaum 01/28/2016, 3:17 PM

## 2016-01-29 ENCOUNTER — Encounter: Payer: Self-pay | Admitting: Internal Medicine

## 2016-01-29 LAB — COMPREHENSIVE METABOLIC PANEL
ALBUMIN: 2.8 g/dL — AB (ref 3.5–5.0)
ALK PHOS: 117 U/L (ref 38–126)
ALT: 336 U/L — AB (ref 17–63)
ANION GAP: 9 (ref 5–15)
AST: 248 U/L — ABNORMAL HIGH (ref 15–41)
BUN: <5 mg/dL — ABNORMAL LOW (ref 6–20)
CALCIUM: 8.3 mg/dL — AB (ref 8.9–10.3)
CHLORIDE: 101 mmol/L (ref 101–111)
CO2: 27 mmol/L (ref 22–32)
Creatinine, Ser: 0.76 mg/dL (ref 0.61–1.24)
GFR calc non Af Amer: >60 mL/min (ref >60)
GLUCOSE: 97 mg/dL (ref 65–99)
Potassium: 3.6 mmol/L (ref 3.5–5.1)
SODIUM: 137 mmol/L (ref 135–145)
Total Bilirubin: 0.2 mg/dL — ABNORMAL LOW (ref 0.3–1.2)
Total Protein: 5.4 g/dL — ABNORMAL LOW (ref 6.5–8.1)

## 2016-01-29 LAB — CBC
HCT: 31.5 % — ABNORMAL LOW (ref 39.0–52.0)
HEMOGLOBIN: 10.7 g/dL — AB (ref 13.0–17.0)
MCH: 28.2 pg (ref 26.0–34.0)
MCHC: 34 g/dL (ref 30.0–36.0)
MCV: 82.9 fL (ref 78.0–100.0)
Platelets: 190 10*3/uL (ref 150–400)
RBC: 3.8 MIL/uL — AB (ref 4.22–5.81)
RDW: 11.7 % (ref 11.5–15.5)
WBC: 2.5 10*3/uL — ABNORMAL LOW (ref 4.0–10.5)

## 2016-01-29 LAB — BLOOD CULTURE ID PANEL (REFLEXED)
Acinetobacter baumannii: NOT DETECTED
CANDIDA GLABRATA: NOT DETECTED
CANDIDA KRUSEI: NOT DETECTED
CANDIDA PARAPSILOSIS: NOT DETECTED
CANDIDA TROPICALIS: NOT DETECTED
Candida albicans: NOT DETECTED
ENTEROBACTER CLOACAE COMPLEX: NOT DETECTED
ESCHERICHIA COLI: NOT DETECTED
Enterobacteriaceae species: NOT DETECTED
Enterococcus species: NOT DETECTED
Haemophilus influenzae: NOT DETECTED
KLEBSIELLA OXYTOCA: NOT DETECTED
KLEBSIELLA PNEUMONIAE: NOT DETECTED
Listeria monocytogenes: NOT DETECTED
Neisseria meningitidis: NOT DETECTED
PROTEUS SPECIES: NOT DETECTED
PSEUDOMONAS AERUGINOSA: NOT DETECTED
SERRATIA MARCESCENS: NOT DETECTED
STAPHYLOCOCCUS SPECIES: NOT DETECTED
STREPTOCOCCUS PNEUMONIAE: NOT DETECTED
Staphylococcus aureus (BCID): NOT DETECTED
Streptococcus agalactiae: NOT DETECTED
Streptococcus pyogenes: NOT DETECTED
Streptococcus species: NOT DETECTED

## 2016-01-29 LAB — HEPATITIS PANEL, ACUTE
HCV Ab: <0.1 s/co ratio (ref 0.0–0.9)
HEP A IGM: NEGATIVE
HEP B C IGM: NEGATIVE
Hepatitis B Surface Ag: NEGATIVE

## 2016-01-29 LAB — GLUCOSE, CAPILLARY: GLUCOSE-CAPILLARY: 105 mg/dL — AB (ref 65–99)

## 2016-01-29 LAB — CK: Total CK: 46 U/L — ABNORMAL LOW (ref 49–397)

## 2016-01-29 MED ORDER — SODIUM CHLORIDE 0.9 % IV SOLN
725.0000 mg | INTRAVENOUS | Status: DC
  Administered 2016-01-29 – 2016-01-30 (×2): 725 mg via INTRAVENOUS
  Filled 2016-01-29 (×5): qty 14.5

## 2016-01-29 NOTE — Progress Notes (Signed)
Pharmacy Antibiotic Note Ryan Guerra is a 25 y.o. male admitted on 01/25/2016 with fever and chills. He was on Ceftriaxone and vancomycin for treatment of septic arthritis. Concern that symptoms may be drug related and antibiotics changed to meropenem and daptomycin on 12/4.   Plan: Increase daptomycin dose to 725 mg (8 mg/ kg) Continue meropenem at 1 gram IV every 8 hours Await pending culture data  Weekly CK while on daptomycin   Height: 6\' 7"  (200.7 cm) IBW/kg (Calculated) : 93.7  Temp (24hrs), Avg:99 F (37.2 C), Min:98.5 F (36.9 C), Max:99.6 F (37.6 C)   Recent Labs Lab 01/25/16 2045 01/25/16 2102 01/25/16 2350 01/26/16 0205 01/26/16 0540 01/27/16 0509 01/27/16 1459 01/28/16 0500 01/29/16 0406  WBC 5.4  --   --  4.3  --  3.1*  --  2.9* 2.5*  CREATININE 1.09  --   --  1.14  --  0.94  --  0.80 0.76  LATICACIDVEN  --  2.06* 0.52 1.5 0.7  --   --   --   --   VANCOTROUGH  --   --   --   --   --   --  14*  --   --     Estimated Creatinine Clearance: 182.7 mL/min (by C-G formula based on SCr of 0.76 mg/dL).    No Known Allergies  Antimicrobials this admission: Daptomycin 12/4 >> Vancomycin 11/22 >> 12/4 Meropenem 12/2>> (02/26/16) Rocephin 11/24 >>12/1 Cefepime x 1 12/1  Microbiology results:  11/22 Deep Wound Cx: negative 12/1 Bld cx x2: 1 of 2 gram variable rod 12/1 Urine cx: negF 12/2 Flu: neg 12/2 flu neg 12/2 RPR neg 12/4 hep neg  Thank you for allowing pharmacy to be a part of this patient's care.  Pollyann SamplesAndy Feliza Diven, PharmD, BCPS 01/29/2016, 11:34 AM Pager: [phone]

## 2016-01-29 NOTE — Progress Notes (Addendum)
PROGRESS NOTE    Ryan Guerra  WUJ:[id number] DOB: Nov 23, 1990 DOA: 01/25/2016 PCP: Jackie PlumSEI-BONSU,GEORGE, MD  Brief Narrative: 25 y.o.malewho underwent an open coracoid transfer shoulder surgery for dislocation on 12/31/2015.  Then developed post op infection and septic arthritis and had I&D on 11-22. Cultures negative, He was d/c home on 11-25 with vancomycin and ceftriaxone.  Then had some persistent pain and swelling in his shoulder, ceftriaxone was changed to cefepime on 11-28. He returned to hospital on 12-1 with fever 102.2 and chills, R shoulder feels fine. ID following, suspect Drug fever, Vanc changed to Daptomycin 12/4  Assessment & Plan:  Fever -recent septic arthritis s/p I&D -no local symptoms, but was able to drain large amount of serous fluid from a tiny wound in his shoulder -MRI R shoulder without abscess scant amount of fluid noted anterior to deltoid on MRI which is draining spontaneously -Drug fever suspected, has PICC line, denies IVDU,  -Blood Cx-1/2 with Gram variable rod which may be a contaminant -appreciate ID consult per Dr.Hatcher -Vanc changed to Daptomycin 12/4, afebrile now  Rising AST/ALT -Acute hepatitis panel negative -no abd symptoms, Bili and ALp normal -stopped Tylenol -could likely due to drug reaction -monitor CMet -sto meropenem if continues to trend up  R shoulder septic arthritis -Abx -Vanc and Merropenem now -see above,  MRI noted -due to continued drainage from R shoulder left msg for Dr.Chandler for his input  Tobacco abuse: -counseled , Nicotine patch  DVT ppx: lovenox Code Status: Full code Family Communication: no family at bed side Disposition Plan:  Home in 1-2days  Consultants:   ID Dr.Hatcher  Antimicrobials:Vanc/Merrem 12/1-12/4 Changed to Dapto and merrem 12/4  Subjective: Feels better, no fevers since last afternoon  Objective: Vitals:   01/28/16 0500 01/28/16 1306 01/28/16 2020 01/29/16 0452  BP:   111/85 104/62 109/60  Pulse:  89 82 65  Resp:  18 18 18   Temp:  98.9 F (37.2 C) 99.6 F (37.6 C) 98.5 F (36.9 C)  TempSrc:  Oral Oral Oral  SpO2:  97% 99% 98%  Height: 6\' 7"  (2.007 m)       Intake/Output Summary (Last 24 hours) at 01/29/16 1123 Last data filed at 01/29/16 0800  Gross per 24 hour  Intake          2102.13 ml  Output                0 ml  Net          2102.13 ml   There were no vitals filed for this visit.  Examination:  General exam: Appears calm and comfortable, AAOx3, no distress Respiratory system: Clear to auscultation. Respiratory effort normal. Cardiovascular system: S1 & S2 heard, RRR. No JVD, murmurs, rubs, gallops or clicks. No pedal edema. Gastrointestinal system: Abdomen is nondistended, soft and nontender. No organomegaly or masses felt. Normal bowel sounds heard. Central nervous system: Alert and oriented. No focal neurological deficits. Extremities:R shoulder with healed surgical scar, no swelling or tenderness anywhere, drainage noted-serous with minimal purulence Skin: flushed, diffuse erythematous rash-much improved Psychiatry: Judgement and insight appear normal. Mood & affect appropriate.     Data Reviewed: I have personally reviewed following labs and imaging studies  CBC:  Recent Labs Lab 01/25/16 2045 01/26/16 0205 01/27/16 0509 01/28/16 0500 01/29/16 0406  WBC 5.4 4.3 3.1* 2.9* 2.5*  NEUTROABS 4.2  --   --   --   --   HGB 12.6* 12.1* 10.6* 10.7* 10.7*  HCT 36.3*  35.8* 31.1* 30.8* 31.5*  MCV 83.1 83.6 82.7 82.6 82.9  PLT 257 227 184 154 190   Basic Metabolic Panel:  Recent Labs Lab 01/25/16 2045 01/26/16 0205 01/27/16 0509 01/28/16 0500 01/29/16 0406  NA 135 136 133* 136 137  K 3.5 4.0 3.3* 3.7 3.6  CL 98* 100* 101 101 101  CO2 24 25 24 26 27   GLUCOSE 109* 123* 99 101* 97  BUN 8 8 6 6  <5*  CREATININE 1.09 1.14 0.94 0.80 0.76  CALCIUM 8.6* 8.4* 7.9* 8.2* 8.3*   GFR: Estimated Creatinine Clearance: 182.7 mL/min  (by C-G formula based on SCr of 0.76 mg/dL). Liver Function Tests:  Recent Labs Lab 01/25/16 2045 01/27/16 0509 01/28/16 0500 01/29/16 0406  AST 45* 122* 167* 248*  ALT 55 140* 212* 336*  ALKPHOS 76 72 93 117  BILITOT 0.5 0.4 0.4 0.2*  PROT 6.7 5.3* 5.7* 5.4*  ALBUMIN 3.1* 2.4* 2.7* 2.8*   No results for input(s): LIPASE, AMYLASE in the last 168 hours. No results for input(s): AMMONIA in the last 168 hours. Coagulation Profile:  Recent Labs Lab 01/25/16 2045  INR 1.25   Cardiac Enzymes:  Recent Labs Lab 01/28/16 1054 01/29/16 0406  CKTOTAL 50 46*   BNP (last 3 results) No results for input(s): PROBNP in the last 8760 hours. HbA1C: No results for input(s): HGBA1C in the last 72 hours. CBG:  Recent Labs Lab 01/28/16 0623 01/28/16 2018 01/29/16 0637  GLUCAP 89 99 105*   Lipid Profile: No results for input(s): CHOL, HDL, LDLCALC, TRIG, CHOLHDL, LDLDIRECT in the last 72 hours. Thyroid Function Tests: No results for input(s): TSH, T4TOTAL, FREET4, T3FREE, THYROIDAB in the last 72 hours. Anemia Panel: No results for input(s): VITAMINB12, FOLATE, FERRITIN, TIBC, IRON, RETICCTPCT in the last 72 hours. Urine analysis:    Component Value Date/Time   COLORURINE YELLOW 01/25/2016 2048   APPEARANCEUR CLEAR 01/25/2016 2048   LABSPEC 1.019 01/25/2016 2048   PHURINE 7.5 01/25/2016 2048   GLUCOSEU NEGATIVE 01/25/2016 2048   HGBUR NEGATIVE 01/25/2016 2048   BILIRUBINUR NEGATIVE 01/25/2016 2048   KETONESUR NEGATIVE 01/25/2016 2048   PROTEINUR NEGATIVE 01/25/2016 2048   NITRITE NEGATIVE 01/25/2016 2048   LEUKOCYTESUR NEGATIVE 01/25/2016 2048   Sepsis Labs: @LABRCNTIP (procalcitonin:4,lacticidven:4)  ) Recent Results (from the past 240 hour(s))  Culture, blood (Routine x 2)     Status: None (Preliminary result)   Collection Time: 01/25/16  8:35 PM  Result Value Ref Range Status   Specimen Description BLOOD RIGHT ARM  Final   Special Requests BOTTLES DRAWN AEROBIC  AND ANAEROBIC 5CC  Final   Culture NO GROWTH 3 DAYS  Final   Report Status PENDING  Incomplete  Culture, blood (Routine x 2)     Status: None (Preliminary result)   Collection Time: 01/25/16  8:40 PM  Result Value Ref Range Status   Specimen Description BLOOD RIGHT HAND  Final   Special Requests IN PEDIATRIC BOTTLE 4CC  Final   Culture  Setup Time   Final    GRAM VARIABLE ROD IN PEDIATRIC BOTTLE Organism ID to follow CRITICAL RESULT CALLED TO, READ BACK BY AND VERIFIED WITH: V BRYK,PHARMD @0719  01/29/16 MKELLY,MLT    Culture NO GROWTH 3 DAYS  Final   Report Status PENDING  Incomplete  Blood Culture ID Panel (Reflexed)     Status: None   Collection Time: 01/25/16  8:40 PM  Result Value Ref Range Status   Enterococcus species NOT DETECTED NOT DETECTED Final  Listeria monocytogenes NOT DETECTED NOT DETECTED Final   Staphylococcus species NOT DETECTED NOT DETECTED Final   Staphylococcus aureus NOT DETECTED NOT DETECTED Final   Streptococcus species NOT DETECTED NOT DETECTED Final   Streptococcus agalactiae NOT DETECTED NOT DETECTED Final   Streptococcus pneumoniae NOT DETECTED NOT DETECTED Final   Streptococcus pyogenes NOT DETECTED NOT DETECTED Final   Acinetobacter baumannii NOT DETECTED NOT DETECTED Final   Enterobacteriaceae species NOT DETECTED NOT DETECTED Final   Enterobacter cloacae complex NOT DETECTED NOT DETECTED Final   Escherichia coli NOT DETECTED NOT DETECTED Final   Klebsiella oxytoca NOT DETECTED NOT DETECTED Final   Klebsiella pneumoniae NOT DETECTED NOT DETECTED Final   Proteus species NOT DETECTED NOT DETECTED Final   Serratia marcescens NOT DETECTED NOT DETECTED Final   Haemophilus influenzae NOT DETECTED NOT DETECTED Final   Neisseria meningitidis NOT DETECTED NOT DETECTED Final   Pseudomonas aeruginosa NOT DETECTED NOT DETECTED Final   Candida albicans NOT DETECTED NOT DETECTED Final   Candida glabrata NOT DETECTED NOT DETECTED Final   Candida krusei NOT  DETECTED NOT DETECTED Final   Candida parapsilosis NOT DETECTED NOT DETECTED Final   Candida tropicalis NOT DETECTED NOT DETECTED Final  Urine culture     Status: None   Collection Time: 01/25/16  8:48 PM  Result Value Ref Range Status   Specimen Description URINE, RANDOM  Final   Special Requests NONE  Final   Culture NO GROWTH  Final   Report Status 01/27/2016 FINAL  Final         Radiology Studies: Mr Shoulder Right W Wo Contrast  Result Date: 01/27/2016 CLINICAL DATA:  25 year old status post open coracoid transfer surgery for dislocation 12/31/2015 complicated by postoperative infection and septic arthritis requiring irrigation and drainage 01/16/2016. Subsequent antibiotics. Persistent pain, swelling and fever. EXAM: MRI OF THE RIGHT SHOULDER WITHOUT AND WITH CONTRAST TECHNIQUE: Multiplanar, multisequence MR imaging of the right shoulder shoulder was performed before and after the administration of intravenous contrast. CONTRAST:  15mL MULTIHANCE GADOBENATE DIMEGLUMINE 529 MG/ML IV SOLN COMPARISON:  Preoperative CT 09/20/2015. Intraoperative radiographs 12/31/2015. FINDINGS: There is moderate susceptibility artifact related to the 2 anchor screws posteriorly in the glenoid. Protocol was adjusted to minimize this artifact. Rotator cuff:  Intact. Muscles: Presumed postsurgical changes anteriorly within the deltoid and subscapularis muscles with heterogeneous T2 signal, small fluid collections and diffuse enhancement. The additional components of the rotator cuff appear normal. Biceps long head:  Intact and normally positioned. Acromioclavicular Joint: Normal. Type 1 acromion. No significant subacromial bursal fluid. Glenohumeral Joint: Marked synovial thickening with diffuse enhancement following contrast. There is relatively little joint fluid on the post-contrast images, mostly in the axillary recess (image 17 of series 15). Synovial enhancement extends into the bicipital groove. Labrum:  Partly obscured by artifact posteriorly and inferiorly. No evidence labral tear. Bones: Postsurgical changes related to open coracoid transfer and inferior glenoid reconstruction. Marrow assessment limited by artifact. There is no worrisome marrow signal or enhancement. There is no cortical destruction. Chronic Hill-Sachs deformity noted. Other: There is a track of peripherally enhancing fluid extending anteriorly into the deltoid muscle, measuring up to 4.0 x 1.1 cm on image 17 of series 15. IMPRESSION: 1. Severe synovitis with marked synovial thickening and fairly homogeneous enhancement following contrast. There is minimal joint fluid. 2. Small amount of fluid extending anteriorly into the deltoid muscle, presumed postsurgical. No other periarticular fluid collections. Potential anterior myositis. 3. Postsurgical changes from inferior glenoid reconstruction. No evidence of osteomyelitis. Electronically  Signed   By: Carey Bullocks M.D.   On: 01/27/2016 15:10        Scheduled Meds: . DAPTOmycin (CUBICIN)  IV  6 mg/kg Intravenous Q24H  . enoxaparin (LOVENOX) injection  40 mg Subcutaneous Q24H  . Influenza vac split quadrivalent PF  0.5 mL Intramuscular Tomorrow-1000  . meropenem (MERREM) IV  1 g Intravenous Q8H  . nicotine  21 mg Transdermal Daily  . sodium chloride flush  3 mL Intravenous Q12H   Continuous Infusions: . sodium chloride 75 mL/hr at 01/29/16 0041     LOS: 3 days    Time spent:    Zannie Cove, MD [facility] Pager [phone]  If 7PM-7AM, please contact night-coverage [email or web address] Password [email or web address] 01/29/2016, 11:23 AM

## 2016-01-29 NOTE — Progress Notes (Signed)
PHARMACY - PHYSICIAN COMMUNICATION CRITICAL VALUE ALERT - BLOOD CULTURE IDENTIFICATION (BCID)  Results for orders placed or performed during the hospital encounter of 01/25/16  Blood Culture ID Panel (Reflexed) (Collected: 01/25/2016  8:40 PM)  Result Value Ref Range   Enterococcus species NOT DETECTED NOT DETECTED   Listeria monocytogenes NOT DETECTED NOT DETECTED   Staphylococcus species NOT DETECTED NOT DETECTED   Staphylococcus aureus NOT DETECTED NOT DETECTED   Streptococcus species NOT DETECTED NOT DETECTED   Streptococcus agalactiae NOT DETECTED NOT DETECTED   Streptococcus pneumoniae NOT DETECTED NOT DETECTED   Streptococcus pyogenes NOT DETECTED NOT DETECTED   Acinetobacter baumannii NOT DETECTED NOT DETECTED   Enterobacteriaceae species NOT DETECTED NOT DETECTED   Enterobacter cloacae complex NOT DETECTED NOT DETECTED   Escherichia coli NOT DETECTED NOT DETECTED   Klebsiella oxytoca NOT DETECTED NOT DETECTED   Klebsiella pneumoniae NOT DETECTED NOT DETECTED   Proteus species NOT DETECTED NOT DETECTED   Serratia marcescens NOT DETECTED NOT DETECTED   Haemophilus influenzae NOT DETECTED NOT DETECTED   Neisseria meningitidis NOT DETECTED NOT DETECTED   Pseudomonas aeruginosa NOT DETECTED NOT DETECTED   Candida albicans NOT DETECTED NOT DETECTED   Candida glabrata NOT DETECTED NOT DETECTED   Candida krusei NOT DETECTED NOT DETECTED   Candida parapsilosis NOT DETECTED NOT DETECTED   Candida tropicalis NOT DETECTED NOT DETECTED    24 yoM with hx septic arthritis that was on IV ceftriaxone and vancomycin as outpatient that was admitted on 01/26/16 with fever and chills. Antibiotics have been changed to Meropenem and daptomycin during this admission. This morning microbiology lab called stating that patient has grown a gram variable rod in 12/1 blood cultures with a negative BCID as above.       Name of physician (or Provider) Contacted: Hatcher  Changes to prescribed antibiotics  required: none, possible contaminant; however, if felt to be pathogenic should be covered by current antimicrobial regimen.   Ryan NightingaleJames A Eldra Guerra 01/29/2016  7:37 AM

## 2016-01-29 NOTE — Progress Notes (Signed)
INFECTIOUS DISEASE PROGRESS NOTE  ID: Ryan Guerra is a 25 y.o. male with  Principal Problem:   Sepsis ([place]) Active Problems:   Pyogenic arthritis of right shoulder region [facility])   Rash   Tobacco abuse   Fever  Subjective: Pain in shoulder, feels fluid in his shoulder but no fluid expressed.   Abtx:  Anti-infectives    Start     Dose/Rate Route Frequency Ordered Stop   01/28/16 1130  DAPTOmycin (CUBICIN) 544 mg in sodium chloride 0.9 % IVPB     6 mg/kg  90.7 kg 221.8 mL/hr over 30 Minutes Intravenous Every 24 hours 01/28/16 1059     01/27/16 1730  vancomycin (VANCOCIN) 1,500 mg in sodium chloride 0.9 % 500 mL IVPB  Status:  Discontinued     1,500 mg 250 mL/hr over 120 Minutes Intravenous Every 8 hours 01/27/16 1710 01/28/16 0934   01/26/16 0800  vancomycin (VANCOCIN) 1,250 mg in sodium chloride 0.9 % 250 mL IVPB  Status:  Discontinued     1,250 mg 166.7 mL/hr over 90 Minutes Intravenous Every 8 hours 01/26/16 0206 01/27/16 1710   01/26/16 0600  ceFEPIme (MAXIPIME) 2 g in dextrose 5 % 50 mL IVPB  Status:  Discontinued     2 g 100 mL/hr over 30 Minutes Intravenous Every 8 hours 01/26/16 0206 01/26/16 0210   01/26/16 0230  meropenem (MERREM) 1 g in sodium chloride 0.9 % 100 mL IVPB     1 g 200 mL/hr over 30 Minutes Intravenous Every 8 hours 01/26/16 0213     01/26/16 0200  vancomycin IVPB  Status:  Discontinued    Comments:  Indication:  Septic joint  Last Day of Therapy: 02/28/16 Labs - Sunday/Monday:  CBC/D, BMP, and vancomycin trough. Labs - Thursday:  BMP and vancomycin trough Labs - Every other week:  ESR and CRP     1,250 mg Intravenous Every 8 hours 01/26/16 0152 01/26/16 0204   01/26/16 0200  ceFEPIme (MAXIPIME) 2 g in dextrose 5 % 50 mL IVPB  Status:  Discontinued     2 g 100 mL/hr over 30 Minutes Intravenous Every 24 hours 01/26/16 0159 01/26/16 0204   01/25/16 2215  vancomycin (VANCOCIN) 1,250 mg in sodium chloride 0.9 % 250 mL IVPB     1,250 mg 166.7 mL/hr  over 90 Minutes Intravenous  Once 01/25/16 2212 01/26/16 0106   01/25/16 2215  ceFEPIme (MAXIPIME) 2 g in dextrose 5 % 50 mL IVPB     2 g 100 mL/hr over 30 Minutes Intravenous  Once 01/25/16 2212 01/25/16 2320      Medications:  Scheduled: . DAPTOmycin (CUBICIN)  IV  6 mg/kg Intravenous Q24H  . enoxaparin (LOVENOX) injection  40 mg Subcutaneous Q24H  . Influenza vac split quadrivalent PF  0.5 mL Intramuscular Tomorrow-1000  . meropenem (MERREM) IV  1 g Intravenous Q8H  . nicotine  21 mg Transdermal Daily  . sodium chloride flush  3 mL Intravenous Q12H    Objective: Vital signs in last 24 hours: Temp:  [98.5 F (36.9 C)-99.6 F (37.6 C)] 98.5 F (36.9 C) (12/05 0452) Pulse Rate:  [65-89] 65 (12/05 0452) Resp:  [18] 18 (12/05 0452) BP: (104-111)/(60-85) 109/60 (12/05 0452) SpO2:  [97 %-99 %] 98 % (12/05 0452)   General appearance: alert, cooperative and no distress Resp: clear to auscultation bilaterally Cardio: regular rate and rhythm GI: normal findings: bowel sounds normal and soft, non-tender Extremities: mild axillary tenderness. no d/c from wound.  Skin: rash mostly resolved.   Lab Results  Recent Labs  01/28/16 0500 01/29/16 0406  WBC 2.9* 2.5*  HGB 10.7* 10.7*  HCT 30.8* 31.5*  NA 136 137  K 3.7 3.6  CL 101 101  CO2 26 27  BUN 6 <5*  CREATININE 0.80 0.76   Liver Panel  Recent Labs  01/28/16 0500 01/29/16 0406  PROT 5.7* 5.4*  ALBUMIN 2.7* 2.8*  AST 167* 248*  ALT 212* 336*  ALKPHOS 93 117  BILITOT 0.4 0.2*   Sedimentation Rate No results for input(s): ESRSEDRATE in the last 72 hours. C-Reactive Protein No results for input(s): CRP in the last 72 hours.  Microbiology: Recent Results (from the past 240 hour(s))  Culture, blood (Routine x 2)     Status: None (Preliminary result)   Collection Time: 01/25/16  8:35 PM  Result Value Ref Range Status   Specimen Description BLOOD RIGHT ARM  Final   Special Requests BOTTLES DRAWN AEROBIC AND  ANAEROBIC 5CC  Final   Culture NO GROWTH 3 DAYS  Final   Report Status PENDING  Incomplete  Culture, blood (Routine x 2)     Status: None (Preliminary result)   Collection Time: 01/25/16  8:40 PM  Result Value Ref Range Status   Specimen Description BLOOD RIGHT HAND  Final   Special Requests IN PEDIATRIC BOTTLE 4CC  Final   Culture  Setup Time   Final    GRAM VARIABLE ROD IN PEDIATRIC BOTTLE Organism ID to follow CRITICAL RESULT CALLED TO, READ BACK BY AND VERIFIED WITH: V BRYK,PHARMD '@[id number]'  01/29/16 MKELLY,MLT    Culture NO GROWTH 3 DAYS  Final   Report Status PENDING  Incomplete  Blood Culture ID Panel (Reflexed)     Status: None   Collection Time: 01/25/16  8:40 PM  Result Value Ref Range Status   Enterococcus species NOT DETECTED NOT DETECTED Final   Listeria monocytogenes NOT DETECTED NOT DETECTED Final   Staphylococcus species NOT DETECTED NOT DETECTED Final   Staphylococcus aureus NOT DETECTED NOT DETECTED Final   Streptococcus species NOT DETECTED NOT DETECTED Final   Streptococcus agalactiae NOT DETECTED NOT DETECTED Final   Streptococcus pneumoniae NOT DETECTED NOT DETECTED Final   Streptococcus pyogenes NOT DETECTED NOT DETECTED Final   Acinetobacter baumannii NOT DETECTED NOT DETECTED Final   Enterobacteriaceae species NOT DETECTED NOT DETECTED Final   Enterobacter cloacae complex NOT DETECTED NOT DETECTED Final   Escherichia coli NOT DETECTED NOT DETECTED Final   Klebsiella oxytoca NOT DETECTED NOT DETECTED Final   Klebsiella pneumoniae NOT DETECTED NOT DETECTED Final   Proteus species NOT DETECTED NOT DETECTED Final   Serratia marcescens NOT DETECTED NOT DETECTED Final   Haemophilus influenzae NOT DETECTED NOT DETECTED Final   Neisseria meningitidis NOT DETECTED NOT DETECTED Final   Pseudomonas aeruginosa NOT DETECTED NOT DETECTED Final   Candida albicans NOT DETECTED NOT DETECTED Final   Candida glabrata NOT DETECTED NOT DETECTED Final   Candida krusei NOT  DETECTED NOT DETECTED Final   Candida parapsilosis NOT DETECTED NOT DETECTED Final   Candida tropicalis NOT DETECTED NOT DETECTED Final  Urine culture     Status: None   Collection Time: 01/25/16  8:48 PM  Result Value Ref Range Status   Specimen Description URINE, RANDOM  Final   Special Requests NONE  Final   Culture NO GROWTH  Final   Report Status 01/27/2016 FINAL  Final    Studies/Results: Mr Shoulder Right W Wo Contrast  Result Date:  01/27/2016 CLINICAL DATA:  25 year old status post open coracoid transfer surgery for dislocation [id number]/8295 complicated by postoperative infection and septic arthritis requiring irrigation and drainage 01/16/2016. Subsequent antibiotics. Persistent pain, swelling and fever. EXAM: MRI OF THE RIGHT SHOULDER WITHOUT AND WITH CONTRAST TECHNIQUE: Multiplanar, multisequence MR imaging of the right shoulder shoulder was performed before and after the administration of intravenous contrast. CONTRAST:  16m MULTIHANCE GADOBENATE DIMEGLUMINE 529 MG/ML IV SOLN COMPARISON:  Preoperative CT 09/20/2015. Intraoperative radiographs 12/31/2015. FINDINGS: There is moderate susceptibility artifact related to the 2 anchor screws posteriorly in the glenoid. Protocol was adjusted to minimize this artifact. Rotator cuff:  Intact. Muscles: Presumed postsurgical changes anteriorly within the deltoid and subscapularis muscles with heterogeneous T2 signal, small fluid collections and diffuse enhancement. The additional components of the rotator cuff appear normal. Biceps long head:  Intact and normally positioned. Acromioclavicular Joint: Normal. Type 1 acromion. No significant subacromial bursal fluid. Glenohumeral Joint: Marked synovial thickening with diffuse enhancement following contrast. There is relatively little joint fluid on the post-contrast images, mostly in the axillary recess (image 17 of series 15). Synovial enhancement extends into the bicipital groove. Labrum: Partly  obscured by artifact posteriorly and inferiorly. No evidence labral tear. Bones: Postsurgical changes related to open coracoid transfer and inferior glenoid reconstruction. Marrow assessment limited by artifact. There is no worrisome marrow signal or enhancement. There is no cortical destruction. Chronic Hill-Sachs deformity noted. Other: There is a track of peripherally enhancing fluid extending anteriorly into the deltoid muscle, measuring up to 4.0 x 1.1 cm on image 17 of series 15. IMPRESSION: 1. Severe synovitis with marked synovial thickening and fairly homogeneous enhancement following contrast. There is minimal joint fluid. 2. Small amount of fluid extending anteriorly into the deltoid muscle, presumed postsurgical. No other periarticular fluid collections. Potential anterior myositis. 3. Postsurgical changes from inferior glenoid reconstruction. No evidence of osteomyelitis. Electronically Signed   By: WRichardean SaleM.D.   On: 01/27/2016 15:10     Assessment/Plan: Septic Arthritis with hardware Adverse Drug Reaction  Total days of antibiotics: 12 (dapto/merrem)  Await ID of his BCx Although typically gram variable rods contaminants, corynebacterium and propionibacteria are well described in prosthetic joint infections.  Should be covered by his current anbx, may change merrem to invanz. May be able to drop dapto Rash improving Ortho f/u as well         JBobby RumpfInfectious Diseases (pager) ([phone] 01/29/2016, 9:06 AM  LOS: 3 days

## 2016-01-30 LAB — COMPREHENSIVE METABOLIC PANEL
ALBUMIN: 2.9 g/dL — AB (ref 3.5–5.0)
ALT: 487 U/L — AB (ref 17–63)
AST: 274 U/L — AB (ref 15–41)
Alkaline Phosphatase: 151 U/L — ABNORMAL HIGH (ref 38–126)
Anion gap: 8 (ref 5–15)
BILIRUBIN TOTAL: 0.5 mg/dL (ref 0.3–1.2)
BUN: 7 mg/dL (ref 6–20)
CO2: 29 mmol/L (ref 22–32)
Calcium: 8.8 mg/dL — ABNORMAL LOW (ref 8.9–10.3)
Chloride: 104 mmol/L (ref 101–111)
Creatinine, Ser: 0.64 mg/dL (ref 0.61–1.24)
GFR calc Af Amer: >60 mL/min (ref >60)
GFR calc non Af Amer: >60 mL/min (ref >60)
GLUCOSE: 100 mg/dL — AB (ref 65–99)
POTASSIUM: 3.8 mmol/L (ref 3.5–5.1)
Sodium: 141 mmol/L (ref 135–145)
TOTAL PROTEIN: 6 g/dL — AB (ref 6.5–8.1)

## 2016-01-30 LAB — CULTURE, BLOOD (ROUTINE X 2)

## 2016-01-30 LAB — CBC
HEMATOCRIT: 34.1 % — AB (ref 39.0–52.0)
Hemoglobin: 11.7 g/dL — ABNORMAL LOW (ref 13.0–17.0)
MCH: 28.5 pg (ref 26.0–34.0)
MCHC: 34.3 g/dL (ref 30.0–36.0)
MCV: 83 fL (ref 78.0–100.0)
Platelets: 232 10*3/uL (ref 150–400)
RBC: 4.11 MIL/uL — ABNORMAL LOW (ref 4.22–5.81)
RDW: 11.7 % (ref 11.5–15.5)
WBC: 3.1 10*3/uL — ABNORMAL LOW (ref 4.0–10.5)

## 2016-01-30 MED ORDER — SODIUM CHLORIDE 0.9 % IV SOLN
1.0000 g | INTRAVENOUS | Status: DC
  Administered 2016-01-30 – 2016-01-31 (×2): 1 g via INTRAVENOUS
  Filled 2016-01-30 (×2): qty 1

## 2016-01-30 NOTE — Progress Notes (Signed)
RN called MD to ask if patient allowed to shower, since patient discontinued his own telemetry. RN called by central tele. MD stated tele can be discontinued and patient may shower. Pt said previous RN allowed him to shower, and that his girlfriend wraps his PICC line dressing. RN stated that was incorrect and that PICC is very sensitive and must be cared for by nursing staff ONLY. Rn will d/c tele, and wrap PICC and allow patient to shower.

## 2016-01-30 NOTE — Progress Notes (Addendum)
PROGRESS NOTE    Ryan Guerra  OZH:[id number] DOB: 1990/09/19 DOA: 01/25/2016 PCP: [name], MD  Brief Narrative: 25 y.o.malewho underwent an open coracoid transfer shoulder surgery for dislocation on 12/31/2015.  Then developed post op infection and septic arthritis and had I&D on 11-22. Cultures negative, He was d/c home on 11-25 with vancomycin and ceftriaxone.  Then had some persistent pain and swelling in his shoulder, ceftriaxone was changed to cefepime on 11-28. He returned to hospital on 12-1 with fever 102.2 and chills, R shoulder feels fine. ID following, suspect Drug fever, Vanc changed to Daptomycin 12/4, MRI without abscess, fevers and rash resolved. Now with rising LFTs, Meropenem changed to [facility] 12/6  Assessment & Plan:  Drug Fever -likely due to Vancomycin, improved -recent septic arthritis s/p I&D -no local symptoms, MRI R shoulder without abscess scant amount of fluid noted anterior to deltoid on MRI which is draining spontaneously -Drug fever suspected, has PICC line, denies IVDU,  -Blood Cx-1/2 with Gram variable rod which may be a contaminant. -appreciate ID consult per Dr.Hatcher -Vanc changed to Daptomycin 12/4, afebrile now -Meropenem changed to Invanz today due to rising LFTs  Rising AST/ALT -Acute hepatitis panel negative -no abd symptoms, -stopped Tylenol -could likely due to Meropenem -stopped meropenem 12/6 and monitor Cmet  R shoulder septic arthritis -Abx -dapto and Merropenem now -see above,  MRI noted -seen by Ortho this admit, recommended FU  Tobacco abuse: -counseled , Nicotine patch  DVT ppx: lovenox Code Status: Full code Family Communication: no family at bed side Disposition Plan:  Home in 1-2days, when LFTS stable  Consultants:   ID Dr.Hatcher  Antimicrobials:Vanc 11/25-12/4 Daptomycin 12/4-> Merrem 12/1-12/4 Invanz 12/6->  Subjective: Feels better, no more rash  Objective: Vitals:   01/29/16 0452  01/29/16 1300 01/29/16 2152 01/30/16 0436  BP: 109/60 110/68 108/72 (!) 111/58  Pulse: 65 62 62 76  Resp: 18 18 18 18   Temp: 98.5 F (36.9 C) 97.7 F (36.5 C) 97.6 F (36.4 C) 97.7 F (36.5 C)  TempSrc: Oral Oral Oral Oral  SpO2: 98% 99% 100% 99%  Height:        Intake/Output Summary (Last 24 hours) at 01/30/16 1219 Last data filed at 01/30/16 0800  Gross per 24 hour  Intake             1210 ml  Output                0 ml  Net             1210 ml   There were no vitals filed for this visit.  Examination:  General exam: Appears calm and comfortable, AAOx3, no distress Respiratory system: Clear to auscultation. Respiratory effort normal. Cardiovascular system: S1 & S2 heard, RRR. No JVD, murmurs, rubs, gallops or clicks. No pedal edema. Gastrointestinal system: Abdomen is nondistended, soft and nontender. No organomegaly or masses felt. Normal bowel sounds heard. Central nervous system: Alert and oriented. No focal neurological deficits. Extremities:R shoulder with healed surgical scar, no swelling or tenderness anywhere, drainage noted-serous with minimal purulence Skin: flushed, diffuse erythematous rash-resolved Psychiatry: Judgement and insight appear normal. Mood & affect appropriate.     Data Reviewed: I have personally reviewed following labs and imaging studies  CBC:  Recent Labs Lab 01/25/16 2045 01/26/16 0205 01/27/16 0509 01/28/16 0500 01/29/16 0406 01/30/16 0420  WBC 5.4 4.3 3.1* 2.9* 2.5* 3.1*  NEUTROABS 4.2  --   --   --   --   --  HGB 12.6* 12.1* 10.6* 10.7* 10.7* 11.7*  HCT 36.3* 35.8* 31.1* 30.8* 31.5* 34.1*  MCV 83.1 83.6 82.7 82.6 82.9 83.0  PLT 257 227 184 154 190 232   Basic Metabolic Panel:  Recent Labs Lab 01/26/16 0205 01/27/16 0509 01/28/16 0500 01/29/16 0406 01/30/16 0420  NA 136 133* 136 137 141  K 4.0 3.3* 3.7 3.6 3.8  CL 100* 101 101 101 104  CO2 25 24 26 27 29   GLUCOSE 123* 99 101* 97 100*  BUN 8 6 6  <5* 7  CREATININE  1.14 0.94 0.80 0.76 0.64  CALCIUM 8.4* 7.9* 8.2* 8.3* 8.8*   GFR: Estimated Creatinine Clearance: 182.7 mL/min (by C-G formula based on SCr of 0.64 mg/dL). Liver Function Tests:  Recent Labs Lab 01/25/16 2045 01/27/16 0509 01/28/16 0500 01/29/16 0406 01/30/16 0420  AST 45* 122* 167* 248* 274*  ALT 55 140* 212* 336* 487*  ALKPHOS 76 72 93 117 151*  BILITOT 0.5 0.4 0.4 0.2* 0.5  PROT 6.7 5.3* 5.7* 5.4* 6.0*  ALBUMIN 3.1* 2.4* 2.7* 2.8* 2.9*   No results for input(s): LIPASE, AMYLASE in the last 168 hours. No results for input(s): AMMONIA in the last 168 hours. Coagulation Profile:  Recent Labs Lab 01/25/16 2045  INR 1.25   Cardiac Enzymes:  Recent Labs Lab 01/28/16 1054 01/29/16 0406  CKTOTAL 50 46*   BNP (last 3 results) No results for input(s): PROBNP in the last 8760 hours. HbA1C: No results for input(s): HGBA1C in the last 72 hours. CBG:  Recent Labs Lab 01/28/16 0623 01/28/16 2018 01/29/16 0637  GLUCAP 89 99 105*   Lipid Profile: No results for input(s): CHOL, HDL, LDLCALC, TRIG, CHOLHDL, LDLDIRECT in the last 72 hours. Thyroid Function Tests: No results for input(s): TSH, T4TOTAL, FREET4, T3FREE, THYROIDAB in the last 72 hours. Anemia Panel: No results for input(s): VITAMINB12, FOLATE, FERRITIN, TIBC, IRON, RETICCTPCT in the last 72 hours. Urine analysis:    Component Value Date/Time   COLORURINE YELLOW 01/25/2016 2048   APPEARANCEUR CLEAR 01/25/2016 2048   LABSPEC 1.019 01/25/2016 2048   PHURINE 7.5 01/25/2016 2048   GLUCOSEU NEGATIVE 01/25/2016 2048   HGBUR NEGATIVE 01/25/2016 2048   BILIRUBINUR NEGATIVE 01/25/2016 2048   KETONESUR NEGATIVE 01/25/2016 2048   PROTEINUR NEGATIVE 01/25/2016 2048   NITRITE NEGATIVE 01/25/2016 2048   LEUKOCYTESUR NEGATIVE 01/25/2016 2048   Sepsis Labs: @LABRCNTIP (procalcitonin:4,lacticidven:4)  ) Recent Results (from the past 240 hour(s))  Culture, blood (Routine x 2)     Status: None (Preliminary result)     Collection Time: 01/25/16  8:35 PM  Result Value Ref Range Status   Specimen Description BLOOD RIGHT ARM  Final   Special Requests BOTTLES DRAWN AEROBIC AND ANAEROBIC 5CC  Final   Culture NO GROWTH 4 DAYS  Final   Report Status PENDING  Incomplete  Culture, blood (Routine x 2)     Status: Abnormal   Collection Time: 01/25/16  8:40 PM  Result Value Ref Range Status   Specimen Description BLOOD RIGHT HAND  Final   Special Requests IN PEDIATRIC BOTTLE 4CC  Final   Culture  Setup Time   Final    GRAM VARIABLE ROD IN PEDIATRIC BOTTLE Organism ID to follow CRITICAL RESULT CALLED TO, READ BACK BY AND VERIFIED WITH: V BRYK,PHARMD @0719  01/29/16 MKELLY,MLT    Culture (A)  Final    DIPHTHEROIDS(CORYNEBACTERIUM SPECIES) Standardized susceptibility testing for this organism is not available.    Report Status 01/30/2016 FINAL  Final  Blood Culture ID  Panel (Reflexed)     Status: None   Collection Time: 01/25/16  8:40 PM  Result Value Ref Range Status   Enterococcus species NOT DETECTED NOT DETECTED Final   Listeria monocytogenes NOT DETECTED NOT DETECTED Final   Staphylococcus species NOT DETECTED NOT DETECTED Final   Staphylococcus aureus NOT DETECTED NOT DETECTED Final   Streptococcus species NOT DETECTED NOT DETECTED Final   Streptococcus agalactiae NOT DETECTED NOT DETECTED Final   Streptococcus pneumoniae NOT DETECTED NOT DETECTED Final   Streptococcus pyogenes NOT DETECTED NOT DETECTED Final   Acinetobacter baumannii NOT DETECTED NOT DETECTED Final   Enterobacteriaceae species NOT DETECTED NOT DETECTED Final   Enterobacter cloacae complex NOT DETECTED NOT DETECTED Final   Escherichia coli NOT DETECTED NOT DETECTED Final   Klebsiella oxytoca NOT DETECTED NOT DETECTED Final   Klebsiella pneumoniae NOT DETECTED NOT DETECTED Final   Proteus species NOT DETECTED NOT DETECTED Final   Serratia marcescens NOT DETECTED NOT DETECTED Final   Haemophilus influenzae NOT DETECTED NOT DETECTED  Final   Neisseria meningitidis NOT DETECTED NOT DETECTED Final   Pseudomonas aeruginosa NOT DETECTED NOT DETECTED Final   Candida albicans NOT DETECTED NOT DETECTED Final   Candida glabrata NOT DETECTED NOT DETECTED Final   Candida krusei NOT DETECTED NOT DETECTED Final   Candida parapsilosis NOT DETECTED NOT DETECTED Final   Candida tropicalis NOT DETECTED NOT DETECTED Final  Urine culture     Status: None   Collection Time: 01/25/16  8:48 PM  Result Value Ref Range Status   Specimen Description URINE, RANDOM  Final   Special Requests NONE  Final   Culture NO GROWTH  Final   Report Status 01/27/2016 FINAL  Final         Radiology Studies: No results found.      Scheduled Meds: . DAPTOmycin (CUBICIN)  IV  725 mg Intravenous Q24H  . enoxaparin (LOVENOX) injection  40 mg Subcutaneous Q24H  . ertapenem  1 g Intravenous Q24H  . Influenza vac split quadrivalent PF  0.5 mL Intramuscular Tomorrow-1000  . nicotine  21 mg Transdermal Daily  . sodium chloride flush  3 mL Intravenous Q12H   Continuous Infusions:    LOS: 4 days    Time spent: 35min    Zannie CovePreetha Rylin Seavey, MD [facility] Pager [phone]  If 7PM-7AM, please contact night-coverage [email or web address] Password [email or web address] 01/30/2016, 12:19 PM

## 2016-01-30 NOTE — Progress Notes (Signed)
INFECTIOUS DISEASE PROGRESS NOTE  ID: Ryan Guerra is a 25 y.o. male with  Principal Problem:   Sepsis ([place]) Active Problems:   Pyogenic arthritis of right shoulder region ([place])   Rash   Tobacco abuse   Fever  Subjective: Without complaints No further d/c from wound.   Abtx:  Anti-infectives    Start     Dose/Rate Route Frequency Ordered Stop   01/29/16 1130  DAPTOmycin (CUBICIN) 725 mg in sodium chloride 0.9 % IVPB     725 mg 229 mL/hr over 30 Minutes Intravenous Every 24 hours 01/29/16 1128     01/28/16 1130  DAPTOmycin (CUBICIN) 544 mg in sodium chloride 0.9 % IVPB  Status:  Discontinued     6 mg/kg  90.7 kg 221.8 mL/hr over 30 Minutes Intravenous Every 24 hours 01/28/16 1059 01/29/16 1128   01/27/16 1730  vancomycin (VANCOCIN) 1,500 mg in sodium chloride 0.9 % 500 mL IVPB  Status:  Discontinued     1,500 mg 250 mL/hr over 120 Minutes Intravenous Every 8 hours 01/27/16 1710 01/28/16 0934   01/26/16 0800  vancomycin (VANCOCIN) 1,250 mg in sodium chloride 0.9 % 250 mL IVPB  Status:  Discontinued     1,250 mg 166.7 mL/hr over 90 Minutes Intravenous Every 8 hours 01/26/16 0206 01/27/16 1710   01/26/16 0600  ceFEPIme (MAXIPIME) 2 g in dextrose 5 % 50 mL IVPB  Status:  Discontinued     2 g 100 mL/hr over 30 Minutes Intravenous Every 8 hours 01/26/16 0206 01/26/16 0210   01/26/16 0230  meropenem (MERREM) 1 g in sodium chloride 0.9 % 100 mL IVPB  Status:  Discontinued     1 g 200 mL/hr over 30 Minutes Intravenous Every 8 hours 01/26/16 0213 01/30/16 0718   01/26/16 0200  vancomycin IVPB  Status:  Discontinued    Comments:  Indication:  Septic joint  Last Day of Therapy: 02/28/16 Labs - Sunday/Monday:  CBC/D, BMP, and vancomycin trough. Labs - Thursday:  BMP and vancomycin trough Labs - Every other week:  ESR and CRP     1,250 mg Intravenous Every 8 hours 01/26/16 0152 01/26/16 0204   01/26/16 0200  ceFEPIme (MAXIPIME) 2 g in dextrose 5 % 50 mL IVPB  Status:   Discontinued     2 g 100 mL/hr over 30 Minutes Intravenous Every 24 hours 01/26/16 0159 01/26/16 0204   01/25/16 2215  vancomycin (VANCOCIN) 1,250 mg in sodium chloride 0.9 % 250 mL IVPB     1,250 mg 166.7 mL/hr over 90 Minutes Intravenous  Once 01/25/16 2212 01/26/16 0106   01/25/16 2215  ceFEPIme (MAXIPIME) 2 g in dextrose 5 % 50 mL IVPB     2 g 100 mL/hr over 30 Minutes Intravenous  Once 01/25/16 2212 01/25/16 2320      Medications:  Scheduled: . DAPTOmycin (CUBICIN)  IV  725 mg Intravenous Q24H  . enoxaparin (LOVENOX) injection  40 mg Subcutaneous Q24H  . Influenza vac split quadrivalent PF  0.5 mL Intramuscular Tomorrow-1000  . nicotine  21 mg Transdermal Daily  . sodium chloride flush  3 mL Intravenous Q12H    Objective: Vital signs in last 24 hours: Temp:  [97.6 F (36.4 C)-97.7 F (36.5 C)] 97.7 F (36.5 C) (12/06 0436) Pulse Rate:  [62-76] 76 (12/06 0436) Resp:  [18] 18 (12/06 0436) BP: (108-111)/(58-72) 111/58 (12/06 0436) SpO2:  [99 %-100 %] 99 % (12/06 0436)   General appearance: alert, cooperative and no distress Resp:  clear to auscultation bilaterally Cardio: regular rate and rhythm GI: normal findings: bowel sounds normal and soft, non-tender Extremities: LUE PIC is clean.  R shoulder- no edema, no expressed d/c. no erythema.   Lab Results  Recent Labs  01/29/16 0406 01/30/16 0420  WBC 2.5* 3.1*  HGB 10.7* 11.7*  HCT 31.5* 34.1*  NA 137 141  K 3.6 3.8  CL 101 104  CO2 27 29  BUN <5* 7  CREATININE 0.76 0.64   Liver Panel  Recent Labs  01/29/16 0406 01/30/16 0420  PROT 5.4* 6.0*  ALBUMIN 2.8* 2.9*  AST 248* 274*  ALT 336* 487*  ALKPHOS 117 151*  BILITOT 0.2* 0.5   Sedimentation Rate No results for input(s): ESRSEDRATE in the last 72 hours. C-Reactive Protein No results for input(s): CRP in the last 72 hours.  Microbiology: Recent Results (from the past 240 hour(s))  Culture, blood (Routine x 2)     Status: None (Preliminary  result)   Collection Time: 01/25/16  8:35 PM  Result Value Ref Range Status   Specimen Description BLOOD RIGHT ARM  Final   Special Requests BOTTLES DRAWN AEROBIC AND ANAEROBIC 5CC  Final   Culture NO GROWTH 4 DAYS  Final   Report Status PENDING  Incomplete  Culture, blood (Routine x 2)     Status: None (Preliminary result)   Collection Time: 01/25/16  8:40 PM  Result Value Ref Range Status   Specimen Description BLOOD RIGHT HAND  Final   Special Requests IN PEDIATRIC BOTTLE 4CC  Final   Culture  Setup Time   Final    GRAM VARIABLE ROD IN PEDIATRIC BOTTLE Organism ID to follow CRITICAL RESULT CALLED TO, READ BACK BY AND VERIFIED WITH: V BRYK,PHARMD _0  01/29/16 MKELLY,MLT    Culture NO GROWTH 4 DAYS  Final   Report Status PENDING  Incomplete  Blood Culture ID Panel (Reflexed)     Status: None   Collection Time: 01/25/16  8:40 PM  Result Value Ref Range Status   Enterococcus species NOT DETECTED NOT DETECTED Final   Listeria monocytogenes NOT DETECTED NOT DETECTED Final   Staphylococcus species NOT DETECTED NOT DETECTED Final   Staphylococcus aureus NOT DETECTED NOT DETECTED Final   Streptococcus species NOT DETECTED NOT DETECTED Final   Streptococcus agalactiae NOT DETECTED NOT DETECTED Final   Streptococcus pneumoniae NOT DETECTED NOT DETECTED Final   Streptococcus pyogenes NOT DETECTED NOT DETECTED Final   Acinetobacter baumannii NOT DETECTED NOT DETECTED Final   Enterobacteriaceae species NOT DETECTED NOT DETECTED Final   Enterobacter cloacae complex NOT DETECTED NOT DETECTED Final   Escherichia coli NOT DETECTED NOT DETECTED Final   Klebsiella oxytoca NOT DETECTED NOT DETECTED Final   Klebsiella pneumoniae NOT DETECTED NOT DETECTED Final   Proteus species NOT DETECTED NOT DETECTED Final   Serratia marcescens NOT DETECTED NOT DETECTED Final   Haemophilus influenzae NOT DETECTED NOT DETECTED Final   Neisseria meningitidis NOT DETECTED NOT DETECTED Final   Pseudomonas  aeruginosa NOT DETECTED NOT DETECTED Final   Candida albicans NOT DETECTED NOT DETECTED Final   Candida glabrata NOT DETECTED NOT DETECTED Final   Candida krusei NOT DETECTED NOT DETECTED Final   Candida parapsilosis NOT DETECTED NOT DETECTED Final   Candida tropicalis NOT DETECTED NOT DETECTED Final  Urine culture     Status: None   Collection Time: 01/25/16  8:48 PM  Result Value Ref Range Status   Specimen Description URINE, RANDOM  Final   Special Requests NONE  Final   Culture NO GROWTH  Final   Report Status 01/27/2016 FINAL  Final    Studies/Results: No results found.   Assessment/Plan: Septic Arthritis with hardware Adverse Drug Reaction  Total days of antibiotics: 13 (dapto/merrem)  Will change merrem to invanz Await ID of his BCx (gram variable rod) His rash has resolved CKs normal D/c planning.          Bobby Rumpf Infectious Diseases (pager) [phone] [email or web address] 01/30/2016, 8:58 AM  LOS: 4 days

## 2016-01-31 DIAGNOSIS — A419 Sepsis, unspecified organism: Principal | ICD-10-CM

## 2016-01-31 LAB — COMPREHENSIVE METABOLIC PANEL
ALBUMIN: 2.8 g/dL — AB (ref 3.5–5.0)
ALT: 426 U/L — ABNORMAL HIGH (ref 17–63)
ANION GAP: 10 (ref 5–15)
AST: 172 U/L — ABNORMAL HIGH (ref 15–41)
Alkaline Phosphatase: 154 U/L — ABNORMAL HIGH (ref 38–126)
BILIRUBIN TOTAL: 0.6 mg/dL (ref 0.3–1.2)
BUN: 6 mg/dL (ref 6–20)
CO2: 24 mmol/L (ref 22–32)
Calcium: 8.2 mg/dL — ABNORMAL LOW (ref 8.9–10.3)
Chloride: 106 mmol/L (ref 101–111)
Creatinine, Ser: 0.6 mg/dL — ABNORMAL LOW (ref 0.61–1.24)
GFR calc Af Amer: >60 mL/min (ref >60)
GFR calc non Af Amer: >60 mL/min (ref >60)
GLUCOSE: 95 mg/dL (ref 65–99)
POTASSIUM: 3.4 mmol/L — AB (ref 3.5–5.1)
SODIUM: 140 mmol/L (ref 135–145)
TOTAL PROTEIN: 5.9 g/dL — AB (ref 6.5–8.1)

## 2016-01-31 LAB — CULTURE, BLOOD (ROUTINE X 2): CULTURE: NO GROWTH

## 2016-01-31 MED ORDER — HEPARIN SOD (PORK) LOCK FLUSH 100 UNIT/ML IV SOLN: 250.0000 [IU] | INTRAVENOUS | Status: DC | PRN

## 2016-01-31 MED ORDER — ERTAPENEM IV (FOR PTA / DISCHARGE USE ONLY)
1.0000 g | INTRAVENOUS | 0 refills | Status: AC
Start: 2016-02-01 — End: 2016-02-28

## 2016-01-31 MED ORDER — POTASSIUM CHLORIDE CRYS ER 20 MEQ PO TBCR
40.0000 meq | EXTENDED_RELEASE_TABLET | Freq: Once | ORAL | Status: AC
Start: 2016-01-31 — End: 2016-01-31
  Administered 2016-01-31: 40 meq via ORAL
  Filled 2016-01-31: qty 2

## 2016-01-31 MED ORDER — OXYCODONE HCL 10 MG PO TABS: 10.0000 mg | ORAL_TABLET | ORAL | 0 refills | Status: AC | PRN

## 2016-01-31 MED ORDER — DAPTOMYCIN IV (FOR PTA / DISCHARGE USE ONLY)
725.0000 mg | INTRAVENOUS | 0 refills | Status: AC
Start: 2016-02-01 — End: 2016-02-28

## 2016-01-31 MED ORDER — DAPTOMYCIN 500 MG IV SOLR
725.0000 mg | INTRAVENOUS | Status: AC
  Administered 2016-01-31: 725 mg via INTRAVENOUS
  Filled 2016-01-31: qty 14.5

## 2016-01-31 MED ORDER — SODIUM CHLORIDE 0.9 % IV SOLN: 1.0000 g | INTRAVENOUS | 0 refills | Status: DC

## 2016-01-31 NOTE — Progress Notes (Addendum)
INFECTIOUS DISEASE PROGRESS NOTE  ID: Ryan Guerra is a 25 y.o. male with  Principal Problem:   Sepsis ([place]) Active Problems:   Pyogenic arthritis of right shoulder region ([place])   Rash   Tobacco abuse   Fever  Subjective: No further d/c from shoulder No swelling.   Abtx:  Anti-infectives    Start     Dose/Rate Route Frequency Ordered Stop   01/31/16 0000  ertapenem 1 g in sodium chloride 0.9 % 50 mL     1 g 100 mL/hr over 30 Minutes Intravenous Every 24 hours 01/31/16 0949     01/30/16 1330  ertapenem (INVANZ) 1 g in sodium chloride 0.9 % 50 mL IVPB     1 g 100 mL/hr over 30 Minutes Intravenous Every 24 hours 01/30/16 0939     01/29/16 1130  DAPTOmycin (CUBICIN) 725 mg in sodium chloride 0.9 % IVPB  Status:  Discontinued     725 mg 229 mL/hr over 30 Minutes Intravenous Every 24 hours 01/29/16 1128 01/31/16 1028   01/28/16 1130  DAPTOmycin (CUBICIN) 544 mg in sodium chloride 0.9 % IVPB  Status:  Discontinued     6 mg/kg  90.7 kg 221.8 mL/hr over 30 Minutes Intravenous Every 24 hours 01/28/16 1059 01/29/16 1128   01/27/16 1730  vancomycin (VANCOCIN) 1,500 mg in sodium chloride 0.9 % 500 mL IVPB  Status:  Discontinued     1,500 mg 250 mL/hr over 120 Minutes Intravenous Every 8 hours 01/27/16 1710 01/28/16 0934   01/26/16 0800  vancomycin (VANCOCIN) 1,250 mg in sodium chloride 0.9 % 250 mL IVPB  Status:  Discontinued     1,250 mg 166.7 mL/hr over 90 Minutes Intravenous Every 8 hours 01/26/16 0206 01/27/16 1710   01/26/16 0600  ceFEPIme (MAXIPIME) 2 g in dextrose 5 % 50 mL IVPB  Status:  Discontinued     2 g 100 mL/hr over 30 Minutes Intravenous Every 8 hours 01/26/16 0206 01/26/16 0210   01/26/16 0230  meropenem (MERREM) 1 g in sodium chloride 0.9 % 100 mL IVPB  Status:  Discontinued     1 g 200 mL/hr over 30 Minutes Intravenous Every 8 hours 01/26/16 0213 01/30/16 0718   01/26/16 0200  vancomycin IVPB  Status:  Discontinued    Comments:  Indication:  Septic joint    Last Day of Therapy: 02/28/16 Labs - Sunday/Monday:  CBC/D, BMP, and vancomycin trough. Labs - Thursday:  BMP and vancomycin trough Labs - Every other week:  ESR and CRP     1,250 mg Intravenous Every 8 hours 01/26/16 0152 01/26/16 0204   01/26/16 0200  ceFEPIme (MAXIPIME) 2 g in dextrose 5 % 50 mL IVPB  Status:  Discontinued     2 g 100 mL/hr over 30 Minutes Intravenous Every 24 hours 01/26/16 0159 01/26/16 0204   01/25/16 2215  vancomycin (VANCOCIN) 1,250 mg in sodium chloride 0.9 % 250 mL IVPB     1,250 mg 166.7 mL/hr over 90 Minutes Intravenous  Once 01/25/16 2212 01/26/16 0106   01/25/16 2215  ceFEPIme (MAXIPIME) 2 g in dextrose 5 % 50 mL IVPB     2 g 100 mL/hr over 30 Minutes Intravenous  Once 01/25/16 2212 01/25/16 2320      Medications:  Scheduled: . enoxaparin (LOVENOX) injection  40 mg Subcutaneous Q24H  . ertapenem  1 g Intravenous Q24H  . Influenza vac split quadrivalent PF  0.5 mL Intramuscular Tomorrow-1000  . nicotine  21 mg Transdermal Daily  .  sodium chloride flush  3 mL Intravenous Q12H    Objective: Vital signs in last 24 hours: Temp:  [97.6 F (36.4 C)-97.8 F (36.6 C)] 97.7 F (36.5 C) (12/07 0552) Pulse Rate:  [60-66] 61 (12/07 0552) Resp:  [18] 18 (12/07 0552) BP: (92-118)/(45-68) 92/45 (12/07 0552) SpO2:  [98 %-100 %] 100 % (12/07 0552)   General appearance: alert, cooperative and no distress Chest wall: no tenderness or swelling around R axilla. there is no fluctuance.   Lab Results  Recent Labs  01/29/16 0406 01/30/16 0420 01/31/16 0437  WBC 2.5* 3.1*  --   HGB 10.7* 11.7*  --   HCT 31.5* 34.1*  --   NA 137 141 140  K 3.6 3.8 3.4*  CL 101 104 106  CO2 '27 29 24  ' BUN <5* 7 6  CREATININE 0.76 0.64 0.60*   Liver Panel  Recent Labs  01/30/16 0420 01/31/16 0437  PROT 6.0* 5.9*  ALBUMIN 2.9* 2.8*  AST 274* 172*  ALT 487* 426*  ALKPHOS 151* 154*  BILITOT 0.5 0.6   Sedimentation Rate No results for input(s): ESRSEDRATE in the  last 72 hours. C-Reactive Protein No results for input(s): CRP in the last 72 hours.  Microbiology: Recent Results (from the past 240 hour(s))  Culture, blood (Routine x 2)     Status: None   Collection Time: 01/25/16  8:35 PM  Result Value Ref Range Status   Specimen Description BLOOD RIGHT ARM  Final   Special Requests BOTTLES DRAWN AEROBIC AND ANAEROBIC 5CC  Final   Culture NO GROWTH 6 DAYS  Final   Report Status 01/31/2016 FINAL  Final  Culture, blood (Routine x 2)     Status: Abnormal   Collection Time: 01/25/16  8:40 PM  Result Value Ref Range Status   Specimen Description BLOOD RIGHT HAND  Final   Special Requests IN PEDIATRIC BOTTLE 4CC  Final   Culture  Setup Time   Final    GRAM VARIABLE ROD IN PEDIATRIC BOTTLE Organism ID to follow CRITICAL RESULT CALLED TO, READ BACK BY AND VERIFIED WITH: V BRYK,PHARMD '@0719'  01/29/16 MKELLY,MLT    Culture (A)  Final    DIPHTHEROIDS(CORYNEBACTERIUM SPECIES) Standardized susceptibility testing for this organism is not available.    Report Status 01/30/2016 FINAL  Final  Blood Culture ID Panel (Reflexed)     Status: None   Collection Time: 01/25/16  8:40 PM  Result Value Ref Range Status   Enterococcus species NOT DETECTED NOT DETECTED Final   Listeria monocytogenes NOT DETECTED NOT DETECTED Final   Staphylococcus species NOT DETECTED NOT DETECTED Final   Staphylococcus aureus NOT DETECTED NOT DETECTED Final   Streptococcus species NOT DETECTED NOT DETECTED Final   Streptococcus agalactiae NOT DETECTED NOT DETECTED Final   Streptococcus pneumoniae NOT DETECTED NOT DETECTED Final   Streptococcus pyogenes NOT DETECTED NOT DETECTED Final   Acinetobacter baumannii NOT DETECTED NOT DETECTED Final   Enterobacteriaceae species NOT DETECTED NOT DETECTED Final   Enterobacter cloacae complex NOT DETECTED NOT DETECTED Final   Escherichia coli NOT DETECTED NOT DETECTED Final   Klebsiella oxytoca NOT DETECTED NOT DETECTED Final   Klebsiella  pneumoniae NOT DETECTED NOT DETECTED Final   Proteus species NOT DETECTED NOT DETECTED Final   Serratia marcescens NOT DETECTED NOT DETECTED Final   Haemophilus influenzae NOT DETECTED NOT DETECTED Final   Neisseria meningitidis NOT DETECTED NOT DETECTED Final   Pseudomonas aeruginosa NOT DETECTED NOT DETECTED Final   Candida albicans NOT DETECTED  NOT DETECTED Final   Candida glabrata NOT DETECTED NOT DETECTED Final   Candida krusei NOT DETECTED NOT DETECTED Final   Candida parapsilosis NOT DETECTED NOT DETECTED Final   Candida tropicalis NOT DETECTED NOT DETECTED Final  Urine culture     Status: None   Collection Time: 01/25/16  8:48 PM  Result Value Ref Range Status   Specimen Description URINE, RANDOM  Final   Special Requests NONE  Final   Culture NO GROWTH  Final   Report Status 01/27/2016 FINAL  Final    Studies/Results: No results found.   Assessment/Plan: Septic Arthritis with hardware Adverse Drug Reaction  Total days of antibiotics: 14(dapto/merrem)  He appears to be doing better Will continue his dapto/merrem for at least 6 weeks I am concerned about the long term prognosis of his implant.  BCx is corynebacterium- can cause shoulder infections but with negative shoulder fluid cx I am not clear how true it is in this instance.   Diagnosis: Septic arthritis with hardware  Culture Result: negative  No Known Allergies  Discharge antibiotics: Per pharmacy protocol: daptomycin Invanz 1g IVPB qday  Duration: 42 days End Date: 02-28-16  [facility] Care Per Protocol:  Labs weekly while on IV antibiotics: _x_ CBC with differential _X_ CK _X_ CMP _X_ CRP _X_ ESR __ Vancomycin trough  __ Please pull PIC at completion of IV antibiotics _x_ Please leave PIC in place until doctor has seen patient or been notified  Fax weekly labs to [phone]  Clinic Follow Up Appt: Dr Tommy Medal in 4-6 weeks.           Bobby Rumpf Infectious Diseases (pager) [phone] [email or web address] 01/31/2016, 12:58 PM  LOS: 5 days

## 2016-01-31 NOTE — Care Management Note (Addendum)
Case Management Note Donn PieriniKristi Channin Agustin RN, BSN Unit 2W-Case Manager [phone]  Patient Details  Name: Raliegh IpChristian B Younis MRN: [id number] Date of Birth: [birth year]  Subjective/Objective:  Pt admitted with sepsis, hx of recent pyogenic arthritis of right shoulder region s/p I&D with home IV abx               Action/Plan: PTA pt lived at home [facility]- Pt will need resumption orders for discharge if to resume home IV abx at discharge. CM to follow  Expected Discharge Date:     01/31/16             Expected Discharge Plan:  Home w Home Health Services  In-House Referral:     Discharge planning Services  CM Consult  Post Acute Care Choice:  Home Health, Resumption of Svcs/PTA Provider Choice offered to:  Patient  DME Arranged:    DME Agency:     HH Arranged:  RN, IV Antibiotics HH Agency:  [facility]  Status of Service:  Completed, signed off  If discussed at Long Length of Stay Meetings, dates discussed:    Discharge Disposition: home with home health   Additional Comments:  01/31/16- 1000- Donn PieriniKristi Davan Nawabi RN, CM- pt for d/c home today- spoke with pt at bedside [facility]-  HHRN order placed to resume- pt will need continued home IV abx- have changed to Invanz for home per ID- [facility]- and plan for d/c today- [facility].   1400- update- notified that pt will actually be going home on both Invanz and Daptomycin- have notified Pam with [facility] of added abx need- pt to get dose of Dapto here prior to discharge.   Darrold SpanWebster, Kebrina Friend Hall, RN 01/31/2016, 11:52 AM

## 2016-01-31 NOTE — Progress Notes (Signed)
RN reviewed discharge information with patient, and patients mother. Patient sent with two prescriptions for abx and one for 10mg  oxycodone. IV team called to disconnect from IV fluids and flush PICC. Rn will continue to monitor until patient until patient leaves.

## 2016-01-31 NOTE — Discharge Summary (Signed)
Physician Discharge Summary  Ryan Guerra SAY:[id number] DOB: [birth year] DOA: 01/25/2016  PCP: Benito Mccreedy, MD  Admit date: 01/25/2016 Discharge date: 01/31/2016  Recommendations for Outpatient Follow-up:  1. Pt will need to follow up with PCP in 1-2 weeks post discharge 2. Please obtain BMP to evaluate electrolytes and kidney function 3. Please also check CBC to evaluate Hg and Hct levels 4. Please check LFT's to evaluate liver function tests  5. Antibiotics Invanz and Daptomycin upon discharge, last day of treatment is 02/28/2016, please see instructions details below under med list section  6. PICC line to be removed after completion of ABX  Discharge Diagnoses:  Principal Problem:   Sepsis ([place]) Active Problems:   Pyogenic arthritis of right shoulder region [name])   Rash   Tobacco abuse   Fever  Discharge Condition: Stable  Diet recommendation: Heart healthy diet discussed in details   Brief Narrative:  24 y.o.malewho underwent an open coracoid transfer shoulder surgery for dislocation on 12/31/2015.  Then developed post op infection and septic arthritis and had I&D on 11-22. Cultures negative, He was d/c home on 11-25 with vancomycin and ceftriaxone.  Then had some persistent pain and swelling in his shoulder, ceftriaxone was changed to cefepime on 11-28. He returned to hospital on 12-1 with fever 102.2 and chills, R shoulder feels fine. ID following, suspect Drug fever, Vanc changed to Daptomycin 12/4, MRI without abscess.  Assessment & Plan:  Drug Fever -likely due to Vancomycin, resolved  -recent septic arthritis s/p I&D -no local symptoms, MRI R shoulder without abscess scant amount of fluid noted anterior to deltoid on MRI which is draining spontaneously -Blood Cx-1/2 with Gram variable rod which may be a contaminant. -appreciate ID consult per Dr.Hatcher -Vanc changed to Daptomycin 12/4 -Meropenem changed to Invanz due to rising LFTs -pt to  complete ABX therapy with Daptomycin and Invanz until Jan 4th, 2017  Rising AST/ALT -Acute hepatitis panel negative -no abd symptoms, -stopped Tylenol -could likely be due to Meropenem -stopped meropenem 12/6 and monitor Cmet in an outpatient setting   R shoulder septic arthritis -Abx -dapto and Merropenem now -see above,  MRI noted -seen by Ortho this admit, recommended FU in an outpatient setting   Tobacco abuse: -counseled , Nicotine patch  DVT ppx: lovenox Code Status:Full code Family Communication: no family at bed side Disposition Plan: Home   Consultants:   ID Dr.Hatcher   Procedures/Studies: Dg Chest 2 View  Result Date: 01/25/2016 CLINICAL DATA:  Acute onset of fever and tachycardia. Generalized weakness. Red rash over the right shoulder. Initial encounter. EXAM: CHEST  2 VIEW COMPARISON:  Chest radiograph performed 09/25/2007 FINDINGS: A left PICC is noted ending overlying the mid SVC. The lungs are well-aerated. Pulmonary vascularity is at the upper limits of normal. There is no evidence of focal opacification, pleural effusion or pneumothorax. The heart is normal in size; the mediastinal contour is within normal limits. No acute osseous abnormalities are seen. Postoperative change is noted at the right glenoid, with mild chronic deformity. IMPRESSION: No acute cardiopulmonary process seen. Electronically Signed   By: Garald Balding M.D.   On: 01/25/2016 22:29   Mr Shoulder Right W Wo Contrast  Result Date: 01/27/2016 CLINICAL DATA:  25 year old status post open coracoid transfer surgery for dislocation 23/55/7322 complicated by postoperative infection and septic arthritis requiring irrigation and drainage 01/16/2016. Subsequent antibiotics. Persistent pain, swelling and fever. EXAM: MRI OF THE RIGHT SHOULDER WITHOUT AND WITH CONTRAST TECHNIQUE: Multiplanar, multisequence MR imaging of the  right shoulder shoulder was performed before and after the administration  of intravenous contrast. CONTRAST:  59m MULTIHANCE GADOBENATE DIMEGLUMINE 529 MG/ML IV SOLN COMPARISON:  Preoperative CT 09/20/2015. Intraoperative radiographs 12/31/2015. FINDINGS: There is moderate susceptibility artifact related to the 2 anchor screws posteriorly in the glenoid. Protocol was adjusted to minimize this artifact. Rotator cuff:  Intact. Muscles: Presumed postsurgical changes anteriorly within the deltoid and subscapularis muscles with heterogeneous T2 signal, small fluid collections and diffuse enhancement. The additional components of the rotator cuff appear normal. Biceps long head:  Intact and normally positioned. Acromioclavicular Joint: Normal. Type 1 acromion. No significant subacromial bursal fluid. Glenohumeral Joint: Marked synovial thickening with diffuse enhancement following contrast. There is relatively little joint fluid on the post-contrast images, mostly in the axillary recess (image 17 of series 15). Synovial enhancement extends into the bicipital groove. Labrum: Partly obscured by artifact posteriorly and inferiorly. No evidence labral tear. Bones: Postsurgical changes related to open coracoid transfer and inferior glenoid reconstruction. Marrow assessment limited by artifact. There is no worrisome marrow signal or enhancement. There is no cortical destruction. Chronic Hill-Sachs deformity noted. Other: There is a track of peripherally enhancing fluid extending anteriorly into the deltoid muscle, measuring up to 4.0 x 1.1 cm on image 17 of series 15. IMPRESSION: 1. Severe synovitis with marked synovial thickening and fairly homogeneous enhancement following contrast. There is minimal joint fluid. 2. Small amount of fluid extending anteriorly into the deltoid muscle, presumed postsurgical. No other periarticular fluid collections. Potential anterior myositis. 3. Postsurgical changes from inferior glenoid reconstruction. No evidence of osteomyelitis. Electronically Signed   By:  WRichardean SaleM.D.   On: 01/27/2016 15:10     Discharge Exam: Vitals:   01/30/16 2124 01/31/16 0552  BP: 106/64 (!) 92/45  Pulse: 60 61  Resp: 18 18  Temp: 97.6 F (36.4 C) 97.7 F (36.5 C)   Vitals:   01/30/16 0436 01/30/16 1300 01/30/16 2124 01/31/16 0552  BP: (!) 111/58 118/68 106/64 (!) 92/45  Pulse: 76 66 60 61  Resp: _0 Temp: 97.7 F (36.5 C) 97.8 F (36.6 C) 97.6 F (36.4 C) 97.7 F (36.5 C)  TempSrc: Oral Oral Oral Oral  SpO2: 99% 98% 100% 100%  Height:        General: Pt is alert, follows commands appropriately, not in acute distress Cardiovascular: Regular rate and rhythm, S1/S2 +, no murmurs, no rubs, no gallops Respiratory: Clear to auscultation bilaterally, no wheezing, no crackles, no rhonchi Abdominal: Soft, non tender, non distended, bowel sounds +, no guarding Extremities: no edema, no cyanosis, pulses palpable bilaterally DP and PT Neuro: Grossly nonfocal  Discharge Instructions  Discharge Instructions    Diet - low sodium heart healthy    Complete by:  As directed    Increase activity slowly    Complete by:  As directed        Medication List    STOP taking these medications   cefTRIAXone IVPB Commonly known as:  ROCEPHIN   oxyCODONE-acetaminophen 5-325 MG tablet Commonly known as:  PERCOCET/ROXICET   vancomycin IVPB     TAKE these medications   acetaminophen 325 MG tablet Commonly known as:  TYLENOL Take 650 mg by mouth every 6 (six) hours as needed for mild pain.   daptomycin IVPB Commonly known as:  CUBICIN Inject 725 mg into the vein daily. Indication:  Septic Arthritis Last Day of Therapy:  02/28/16 Labs - Once weekly:  CBC/D, BMP, and CPK Labs - Every  other week:  ESR and CRP   ertapenem IVPB Commonly known as:  INVANZ Inject 1 g into the vein daily. Indication:  Septic Arthritis Last Day of Therapy:  02/28/16 Labs - Once weekly:  CBC/D and BMP, Labs - Every other week:  ESR and CRP   Oxycodone HCl 10 MG  Tabs Take 1 tablet (10 mg total) by mouth every 4 (four) hours as needed for moderate pain or severe pain.            Home Infusion Instuctions        Start     Ordered   01/31/16 0000  Home infusion instructions [facility] May follow [place] Dosing Protocol; May administer Cathflo as needed to maintain patency of vascular access device.; Flushing of vascular access device: per [facility] Protocol: 0.9% NaCl pre/post medica...    Question Answer Comment  Instructions May follow [place] Dosing Protocol   Instructions May administer Cathflo as needed to maintain patency of vascular access device.   Instructions Flushing of vascular access device: per [facility] Protocol: 0.9% NaCl pre/post medication administration and prn patency; Heparin 100 u/ml, 34m for implanted ports and Heparin 10u/ml, 520mfor all other central venous catheters.   Instructions May follow [facility] Anaphylaxis Protocol for First Dose Administration in the home: 0.9% NaCl at 25-50 ml/hr to maintain IV access for protocol meds. Epinephrine 0.3 ml IV/IM PRN and Benadryl 25-50 IV/IM PRN s/s of anaphylaxis.   Instructions [facility] Infusion Coordinator (RN) to assist per patient IV care needs in the home PRN.      01/31/16 1423      Follow-up Information    OSEI-BONSU,GEORGE, MD Follow up.   Specialty:  Internal Medicine Contact information: [address] [address] [phone]          The results of significant diagnostics from this hospitalization (including imaging, microbiology, ancillary and laboratory) are listed below for reference.     Microbiology: Recent Results (from the past 240 hour(s))  Culture, blood (Routine x 2)     Status: None   Collection Time: 01/25/16  8:35 PM  Result Value Ref Range Status   Specimen Description BLOOD RIGHT ARM  Final   Special Requests BOTTLES DRAWN AEROBIC AND ANAEROBIC 5CC  Final   Culture NO GROWTH 6 DAYS  Final   Report  Status 01/31/2016 FINAL  Final  Culture, blood (Routine x 2)     Status: Abnormal   Collection Time: 01/25/16  8:40 PM  Result Value Ref Range Status   Specimen Description BLOOD RIGHT HAND  Final   Special Requests IN PEDIATRIC BOTTLE 4CC  Final   Culture  Setup Time   Final    GRAM VARIABLE ROD IN PEDIATRIC BOTTLE Organism ID to follow CRITICAL RESULT CALLED TO, READ BACK BY AND VERIFIED WITH: V BRYK,PHARMD _0  01/29/16 MKELLY,MLT    Culture (A)  Final    DIPHTHEROIDS(CORYNEBACTERIUM SPECIES) Standardized susceptibility testing for this organism is not available.    Report Status 01/30/2016 FINAL  Final  Blood Culture ID Panel (Reflexed)     Status: None   Collection Time: 01/25/16  8:40 PM  Result Value Ref Range Status   Enterococcus species NOT DETECTED NOT DETECTED Final   Listeria monocytogenes NOT DETECTED NOT DETECTED Final   Staphylococcus species NOT DETECTED NOT DETECTED Final   Staphylococcus aureus NOT DETECTED NOT DETECTED Final   Streptococcus species NOT DETECTED NOT DETECTED Final  Streptococcus agalactiae NOT DETECTED NOT DETECTED Final   Streptococcus pneumoniae NOT DETECTED NOT DETECTED Final   Streptococcus pyogenes NOT DETECTED NOT DETECTED Final   Acinetobacter baumannii NOT DETECTED NOT DETECTED Final   Enterobacteriaceae species NOT DETECTED NOT DETECTED Final   Enterobacter cloacae complex NOT DETECTED NOT DETECTED Final   Escherichia coli NOT DETECTED NOT DETECTED Final   Klebsiella oxytoca NOT DETECTED NOT DETECTED Final   Klebsiella pneumoniae NOT DETECTED NOT DETECTED Final   Proteus species NOT DETECTED NOT DETECTED Final   Serratia marcescens NOT DETECTED NOT DETECTED Final   Haemophilus influenzae NOT DETECTED NOT DETECTED Final   Neisseria meningitidis NOT DETECTED NOT DETECTED Final   Pseudomonas aeruginosa NOT DETECTED NOT DETECTED Final   Candida albicans NOT DETECTED NOT DETECTED Final   Candida glabrata NOT DETECTED NOT DETECTED Final    Candida krusei NOT DETECTED NOT DETECTED Final   Candida parapsilosis NOT DETECTED NOT DETECTED Final   Candida tropicalis NOT DETECTED NOT DETECTED Final  Urine culture     Status: None   Collection Time: 01/25/16  8:48 PM  Result Value Ref Range Status   Specimen Description URINE, RANDOM  Final   Special Requests NONE  Final   Culture NO GROWTH  Final   Report Status 01/27/2016 FINAL  Final     Labs: Basic Metabolic Panel:  Recent Labs Lab 01/27/16 0509 01/28/16 0500 01/29/16 0406 01/30/16 0420 01/31/16 0437  NA 133* 136 137 141 140  K 3.3* 3.7 3.6 3.8 3.4*  CL 101 101 101 104 106  CO2 _0 GLUCOSE 99 101* 97 100* 95  BUN 6 6 <5* 7 6  CREATININE 0.94 0.80 0.76 0.64 0.60*  CALCIUM 7.9* 8.2* 8.3* 8.8* 8.2*   Liver Function Tests:  Recent Labs Lab 01/27/16 0509 01/28/16 0500 01/29/16 0406 01/30/16 0420 01/31/16 0437  AST 122* 167* 248* 274* 172*  ALT 140* 212* 336* 487* 426*  ALKPHOS 72 93 117 151* 154*  BILITOT 0.4 0.4 0.2* 0.5 0.6  PROT 5.3* 5.7* 5.4* 6.0* 5.9*  ALBUMIN 2.4* 2.7* 2.8* 2.9* 2.8*   CBC:  Recent Labs Lab 01/25/16 2045 01/26/16 0205 01/27/16 0509 01/28/16 0500 01/29/16 0406 01/30/16 0420  WBC 5.4 4.3 3.1* 2.9* 2.5* 3.1*  NEUTROABS 4.2  --   --   --   --   --   HGB 12.6* 12.1* 10.6* 10.7* 10.7* 11.7*  HCT 36.3* 35.8* 31.1* 30.8* 31.5* 34.1*  MCV 83.1 83.6 82.7 82.6 82.9 83.0  PLT 257 227 184 154 190 232   Cardiac Enzymes:  Recent Labs Lab 01/28/16 1054 01/29/16 0406  CKTOTAL 50 46*   CBG:  Recent Labs Lab 01/28/16 0623 01/28/16 2018 01/29/16 0637  GLUCAP 89 99 105*     SIGNED: Time coordinating discharge: 30 minutes  MAGICK-Kayani Rapaport, MD  [facility] 01/31/2016, 9:50 AM Pager [phone]  If 7PM-7AM, please contact night-coverage [email or web address] Password [email or web address]

## 2016-01-31 NOTE — Discharge Instructions (Signed)
Fever, Adult °A fever is an increase in the body’s temperature. It is usually defined as a temperature of 100°F (38°C) or higher. Brief mild or moderate fevers generally have no long-term effects, and they often do not require treatment. Moderate or high fevers may make you feel uncomfortable and can sometimes be a sign of a serious illness or disease. The sweating that may occur with repeated or prolonged fever may also cause dehydration. °Fever is confirmed by taking a temperature with a thermometer. A measured temperature can vary with: °· Age. °· Time of day. °· Location of the thermometer: °¨ Mouth (oral). °¨ Rectum (rectal). °¨ Ear (tympanic). °¨ Underarm (axillary). °¨ Forehead (temporal). °Follow these instructions at home: °Pay attention to any changes in your symptoms. Take these actions to help with your condition: °· Take over-the counter and prescription medicines only as told by your health care provider. Follow the dosing instructions carefully. °· If you were prescribed an antibiotic medicine, take it as told by your health care provider. Do not stop taking the antibiotic even if you start to feel better. °· Rest as needed. °· Drink enough fluid to keep your urine clear or pale yellow. This helps to prevent dehydration. °· Sponge yourself or bathe with room-temperature water to help reduce your body temperature as needed. Do not use ice water. °· Do not overbundle yourself in blankets or heavy clothes. °Contact a health care provider if: °· You vomit. °· You cannot eat or drink without vomiting. °· You have diarrhea. °· You have pain when you urinate. °· Your symptoms do not improve with treatment. °· You develop new symptoms. °· You develop excessive weakness. °Get help right away if: °· You have shortness of breath or have trouble breathing. °· You are dizzy or you faint. °· You are disoriented or confused. °· You develop signs of dehydration, such as a dry mouth, decreased urination, or  paleness. °· You develop severe pain in your abdomen. °· You have persistent vomiting or diarrhea. °· You develop a skin rash. °· Your symptoms suddenly get worse. °This information is not intended to replace advice given to you by your health care provider. Make sure you discuss any questions you have with your health care provider. °Document Released: 08/06/2000 Document Revised: 07/19/2015 Document Reviewed: 04/06/2014 °Elsevier Interactive Patient Education © 2017 [employer] ° °

## 2016-02-12 ENCOUNTER — Inpatient Hospital Stay: Payer: Medicaid Other | Admitting: Internal Medicine

## 2016-02-20 ENCOUNTER — Telehealth: Payer: Self-pay | Admitting: Pharmacist Clinician (PhC)/ Clinical Pharmacy Specialist

## 2016-02-20 NOTE — Telephone Encounter (Signed)
[facility] called and said that the pt has not had his abx for the past 3 days. Apparently, there is suspicion that he pull out part of the PICC. He was supposed to be on abx until 1/4. Has an appt with Dr. Daiva EvesVan Dam on 1/3. PICC was supposed to be left in place. [facility] will try to get him to go to the ED to get it fixed.

## 2016-02-21 ENCOUNTER — Emergency Department (HOSPITAL_COMMUNITY)
Admission: EM | Admit: 2016-02-21 | Discharge: 2016-02-21 | Disposition: A | Discharge status: Home / Self Care | Payer: Medicaid Other | Attending: Emergency Medicine | Admitting: Emergency Medicine

## 2016-02-21 ENCOUNTER — Telehealth: Payer: Self-pay | Admitting: *Deleted

## 2016-02-21 ENCOUNTER — Encounter (HOSPITAL_COMMUNITY): Payer: Self-pay

## 2016-02-21 DIAGNOSIS — Z452 Encounter for adjustment and management of vascular access device: Secondary | ICD-10-CM | POA: Insufficient documentation

## 2016-02-21 DIAGNOSIS — F17210 Nicotine dependence, cigarettes, uncomplicated: Secondary | ICD-10-CM | POA: Insufficient documentation

## 2016-02-21 DIAGNOSIS — Z79899 Other long term (current) drug therapy: Secondary | ICD-10-CM | POA: Diagnosis not present

## 2016-02-21 NOTE — Telephone Encounter (Signed)
Pt needs IV removed.  He can go to ER or IR (if he can get appt) or his home RN can do this or [facility]   Thanks

## 2016-02-21 NOTE — ED Notes (Signed)
IV team at bedside 

## 2016-02-21 NOTE — Telephone Encounter (Signed)
[facility]  He partially pulled out his PICC several days ago.  He stated that he wanted the PICC removed, not replaced and did not want to continue IV antibiotic therapy.  [facility] not made a home visit to evaluate the PICC site.  RN reviewed the 02/20/16 telephone note by M. Pham, pharmacist.  Reiterated the information contained in that note about going to the ED to "get it fixed."  The patient stated that he would go to the ED but that he did not want to have the PICC replaced.  RN reminded the patient of his [facility] appointment on 02/27/16 with Dr. Daiva EvesVan Dam at 0900 so that his infection could be evaluated.  The patient made a note of the appointment date and time.

## 2016-02-21 NOTE — ED Provider Notes (Signed)
[facility] DEPT Provider Note   CSN: [id number] Arrival date & time: 02/21/16  1538  By signing my name below, I, Norris Cross, attest that this documentation has been prepared under the direction and in the presence of Etta Quill, NP-C. Electronically Signed: Norris Cross , ED Scribe. 02/21/16. 5:41 PM.    History   Chief Complaint Chief Complaint  Patient presents with  . Vascular Access Problem    HPI  HPI Comments: Ryan Guerra is a 25 y.o. male who presents to the Emergency Department for IV picc line removal. Pt states that he recently has become tired of the picc line and would rather have an IV inserted every time instead of the picc line. Pt states that he has missed 5 doses of his antibiotics but has another week of antibiotic coverage left. The picc line is also dislodged 2 inches from its original location. Pt denies SOB, fever, chills    The history is provided by the patient. No language interpreter was used.    Past Medical History:  Diagnosis Date  . Bipolar disorder ([place])   . GERD (gastroesophageal reflux disease)    no current med.  Sallee Provencal nose 12/25/2015   clear drainage, per pt.  . 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 Sore throat 12/25/2015    Patient Active Problem List   Diagnosis Date Noted  . Sepsis ([place]) 01/26/2016  . Rash 01/26/2016  . Tobacco abuse 01/26/2016  . Fever   . Post op infection 01/17/2016  . 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 Tattoo   . Routine screening for STI (sexually transmitted infection)   . Post-operative infection 01/16/2016    Past Surgical History:  Procedure Laterality Date  . INCISION AND DRAINAGE Right 01/16/2016   shoulder  . IRRIGATION AND DEBRIDEMENT SHOULDER Right 01/16/2016   Procedure: IRRIGATION AND DEBRIDEMENT SHOULDER;  Surgeon: Tania Ade, MD;  Location: [facility];  Service: Orthopedics;  Laterality: Right;  Irrigation and debridement right shoulder  infection  . SHOULDER LATERJET Right 12/31/2015   Procedure: OPEN CORACOID TRANSFER;  Surgeon: Tania Ade, MD;  Location: [facility];  Service: Orthopedics;  Laterality: Right;  . 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 Authorizing Provider  acetaminophen (TYLENOL) 325 MG tablet Take 650 mg by mouth every 6 (six) hours as needed for mild pain.    Historical Provider, MD  daptomycin (CUBICIN) IVPB Inject 725 mg into the vein daily. Indication:  Septic Arthritis Last Day of Therapy:  02/28/16 Labs - Once weekly:  CBC/D, BMP, and CPK Labs - Every other week:  ESR and CRP 02/01/16 02/28/16  Theodis Blaze, MD  ertapenem [facility]) IVPB Inject 1 g into the vein daily. Indication:  Septic Arthritis Last Day of Therapy:  02/28/16 Labs - Once weekly:  CBC/D and BMP, Labs - Every other week:  ESR and CRP 02/01/16 02/28/16  Theodis Blaze, MD  oxyCODONE 10 MG TABS Take 1 tablet (10 mg total) by mouth every 4 (four) hours as needed for moderate pain or severe pain. 01/31/16   Theodis Blaze, MD    Family History History reviewed. No pertinent family history.  Social History Social History  Substance Use Topics  . Smoking status: Current Every Day Smoker    Packs/day: 0.25    Years: 8.00  Types: Cigarettes  . Smokeless tobacco: Former Systems developer  . Alcohol use 0.6 oz/week    1 Cans of beer per week     Allergies   Patient has no known allergies.   Review of Systems Review of Systems  Constitutional: Negative for chills and fever.  HENT: Negative for ear pain and sore throat.   Eyes: Negative for pain and visual disturbance.  Respiratory: Negative for cough and shortness of breath.   Cardiovascular: Negative for chest pain and palpitations.  Gastrointestinal: Negative for abdominal pain and vomiting.  Genitourinary: Negative for dysuria and hematuria.  Musculoskeletal:  Negative for arthralgias and back pain.  Skin: Negative for color change and rash.       Picc line in L upper arm  Neurological: Negative for seizures and syncope.  All other systems reviewed and are negative.    Physical Exam Updated Vital Signs BP 148/93 (BP Location: Right Arm)   Pulse 77   Temp 98.1 F (36.7 C) (Oral)   Resp 16   SpO2 97%   Physical Exam  Constitutional: He appears well-developed and well-nourished.  HENT:  Head: Normocephalic and atraumatic.  Eyes: Conjunctivae are normal.  Neck: Neck supple.  Cardiovascular: Normal rate and regular rhythm.   No murmur heard. Pulmonary/Chest: Effort normal and breath sounds normal. No respiratory distress.  Abdominal: Soft. There is no tenderness.  Musculoskeletal: He exhibits no edema.  Picc line in L upper arm  Neurological: He is alert.  Skin: Skin is warm and dry.  Psychiatric: He has a normal mood and affect.  Nursing note and vitals reviewed.    ED Treatments / Results   DIAGNOSTIC STUDIES: Oxygen Saturation is 97% on RA, adequate by my interpretation.   COORDINATION OF CARE: 5:46 PM-Discussed next steps with pt. Pt verbalized understanding and is agreeable with the plan.    Labs (all labs ordered are listed, but only abnormal results are displayed) Labs Reviewed - No data to display  EKG  EKG Interpretation None       Radiology No results found.  Procedures Procedures (including critical care time)  Medications Ordered in ED Medications - No data to display   Initial Impression / Assessment and Plan / ED Course  I have reviewed the triage vital signs and the nursing notes.  Pertinent labs & imaging results that were available during my care of the patient were reviewed by me and considered in my medical decision making (see chart for details).  Clinical Course   Patient has decided that he no longer wants a PICC line, and wants it removed.  Discussed with patient the difficulty  removal will impose on the completion of his antibiotic therapy for his septic shoulder, yet he insists that it be removed.  IV therapy contacted and PICC line removed per patient request.  He is scheduled to follow-up with ortho tomorrow.    Final Clinical Impressions(s) / ED Diagnoses   Final diagnoses:  PICC (peripherally inserted central catheter) removal    New Prescriptions Discharge Medication List as of 02/21/2016  6:45 PM     I personally performed the services described in this documentation, which was scribed in my presence. The recorded information has been reviewed and is accurate.    Etta Quill, NP 02/22/16 1610    Blanchie Dessert, MD 02/23/16 [phone]

## 2016-02-21 NOTE — ED Triage Notes (Signed)
Pt reports he had PICC line placed following shoulder surgery. PICC line has been partially removed and pt wants it fully removed. He states he does not want the PICC line put back in.

## 2016-02-22 NOTE — Telephone Encounter (Signed)
PICC removed at the ED 02/11/16

## 2016-02-26 NOTE — Telephone Encounter (Signed)
Well this is not good news. His surgeon will also not be happy about this. He is going to have to have another surgery I would bet

## 2016-02-27 ENCOUNTER — Telehealth: Payer: Self-pay | Admitting: *Deleted

## 2016-02-27 ENCOUNTER — Inpatient Hospital Stay: Payer: Medicaid Other | Admitting: Infectious Disease

## 2016-02-27 NOTE — Telephone Encounter (Signed)
Patient no-showed his hospital follow up appointment today. He decided to stop antibiotics against medical advice, went to the ED 12/28 to have his PICC pulled out.  He was supposed to follow up with ortho on 12/29. RN contacted patient this morning.  He is at work, states he is trying to catch up on all the work he has already missed because of his hospitalization.  Per Dr Daiva EvesVan Dam, he needs to contact ortho ASAP and get in there for follow up.  Dr Daiva EvesVan Dam will review ortho notes and we will contact patient for further instruction from there.  Andree CossHowell, Amdrew Oboyle M, RN

## 2017-09-08 ENCOUNTER — Other Ambulatory Visit: Payer: Self-pay

## 2017-09-08 ENCOUNTER — Emergency Department
Admission: EM | Admit: 2017-09-08 | Discharge: 2017-09-08 | Disposition: A | Discharge status: Home / Self Care | Payer: Self-pay | Attending: Emergency Medicine | Admitting: Emergency Medicine

## 2017-09-08 ENCOUNTER — Emergency Department: Payer: Self-pay

## 2017-09-08 DIAGNOSIS — Z79899 Other long term (current) drug therapy: Secondary | ICD-10-CM | POA: Insufficient documentation

## 2017-09-08 DIAGNOSIS — F17210 Nicotine dependence, cigarettes, uncomplicated: Secondary | ICD-10-CM | POA: Insufficient documentation

## 2017-09-08 DIAGNOSIS — K029 Dental caries, unspecified: Secondary | ICD-10-CM

## 2017-09-08 DIAGNOSIS — K0889 Other specified disorders of teeth and supporting structures: Secondary | ICD-10-CM | POA: Insufficient documentation

## 2017-09-08 MED ORDER — IBUPROFEN 800 MG PO TABS: 800.0000 mg | ORAL_TABLET | Freq: Three times a day (TID) | ORAL | 0 refills | Status: DC | PRN

## 2017-09-08 MED ORDER — BUPIVACAINE HCL 0.25 % IJ SOLN
5.0000 mL | Freq: Once | INTRAMUSCULAR | Status: AC
  Administered 2017-09-08: 5 mL
  Filled 2017-09-08: qty 5

## 2017-09-08 MED ORDER — KETOROLAC TROMETHAMINE 30 MG/ML IJ SOLN
30.0000 mg | Freq: Once | INTRAMUSCULAR | Status: AC
  Administered 2017-09-08: 30 mg via INTRAMUSCULAR
  Filled 2017-09-08: qty 1

## 2017-09-08 MED ORDER — AMOXICILLIN 500 MG PO CAPS
500.0000 mg | ORAL_CAPSULE | Freq: Once | ORAL | Status: AC
  Administered 2017-09-08: 500 mg via ORAL
  Filled 2017-09-08: qty 1

## 2017-09-08 MED ORDER — BUPIVACAINE HCL 0.5 % IJ SOLN: 5.0000 mL | Freq: Once | INTRAMUSCULAR | Status: DC

## 2017-09-08 MED ORDER — TRAMADOL HCL 50 MG PO TABS: 50.0000 mg | ORAL_TABLET | Freq: Four times a day (QID) | ORAL | 0 refills | Status: AC | PRN

## 2017-09-08 MED ORDER — BUPIVACAINE HCL (PF) 0.25 % IJ SOLN
INTRAMUSCULAR | Status: AC
  Administered 2017-09-08: 5 mL
  Filled 2017-09-08: qty 30

## 2017-09-08 MED ORDER — AMOXICILLIN 875 MG PO TABS: 875.0000 mg | ORAL_TABLET | Freq: Two times a day (BID) | ORAL | 0 refills | Status: AC

## 2017-09-08 MED ORDER — LIDOCAINE HCL (PF) 1 % IJ SOLN
5.0000 mL | Freq: Once | INTRAMUSCULAR | Status: AC
  Administered 2017-09-08: 5 mL
  Filled 2017-09-08: qty 5

## 2017-09-08 MED ORDER — LIDOCAINE-EPINEPHRINE 2 %-1:100000 IJ SOLN
INTRAMUSCULAR | Status: AC
  Administered 2017-09-08: 19:00:00
  Filled 2017-09-08: qty 1.7

## 2017-09-08 MED ORDER — TRAMADOL HCL 50 MG PO TABS
50.0000 mg | ORAL_TABLET | Freq: Once | ORAL | Status: AC
  Administered 2017-09-08: 50 mg via ORAL
  Filled 2017-09-08: qty 1

## 2017-09-08 MED ORDER — LIDOCAINE-EPINEPHRINE 1 %-1:100000 IJ SOLN: 1.3000 mL | Freq: Once | INTRAMUSCULAR | Status: DC

## 2017-09-08 NOTE — Discharge Instructions (Signed)
Please take ibuprofen 800 mg every 6-8 hours as needed for pain with food.  Please take Tylenol 1000 mg every 6 hours as needed for pain.  Take tramadol as prescribed for moderate to severe pain.  Take antibiotic as prescribed.  Call dental clinic tomorrow morning to schedule follow-up.  Return to the ER for any fevers worsening symptoms or urgent changes in your health.

## 2017-09-08 NOTE — ED Triage Notes (Signed)
Pt c/o toothache for the past couple of days.

## 2017-09-08 NOTE — ED Provider Notes (Signed)
[facility] EMERGENCY DEPARTMENT Provider Note   CSN: [id number] Arrival date & time: 09/08/17  1808     History   Chief Complaint Chief Complaint  Patient presents with  . Dental Pain    HPI Ryan Guerra is a 27 y.o. male.  Presents to the emergency department for evaluation of dental pain.  Patient states he has had 1 month of intermittent pain to the left lower tooth along the left jaw.  He points to his last tooth, wisdom tooth.  He states he has been using Orajel intermittently but today it did not work.  He denies any trauma or injury.  No fevers or drainage.  Pain is currently 8 out of 10.  He states he is been taking ibuprofen with no relief.  He has not followed up with dental clinics.  He has not had any recent dental surgery.  He denies any numbness tingling, headache, vision changes.  No difficulty swallowing, sore throat.  HPI  Past Medical History:  Diagnosis Date  . Bipolar disorder ([facility])   . GERD (gastroesophageal reflux disease)    no current med.  Altamese Cabal nose 12/25/2015   clear drainage, per pt.  . 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 Sore throat 12/25/2015    Patient Active Problem List   Diagnosis Date Noted  . Sepsis ([facility]) 01/26/2016  . Rash 01/26/2016  . Tobacco abuse 01/26/2016  . Fever   . Post op infection 01/17/2016  . 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 Tattoo   . Routine screening for STI (sexually transmitted infection)   . Post-operative infection 01/16/2016    Past Surgical History:  Procedure Laterality Date  . INCISION AND DRAINAGE Right 01/16/2016   shoulder  . IRRIGATION AND DEBRIDEMENT SHOULDER Right 01/16/2016   Procedure: IRRIGATION AND DEBRIDEMENT SHOULDER;  Surgeon: Jones Broom, MD;  Location: [facility] OR;  Service: Orthopedics;  Laterality: Right;  Irrigation and debridement right shoulder infection  . SHOULDER LATERJET Right 12/31/2015   Procedure: OPEN CORACOID TRANSFER;   Surgeon: Jones Broom, MD;  Location: [facility];  Service: Orthopedics;  Laterality: Right;  . 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 Authorizing Provider  acetaminophen (TYLENOL) 325 MG tablet Take 650 mg by mouth every 6 (six) hours as needed for mild pain.    [provider]  amoxicillin (AMOXIL) 875 MG tablet Take 1 tablet (875 mg total) by mouth 2 (two) times daily. X 10 days 09/08/17   Evon Slack, PA-C  ibuprofen (ADVIL,MOTRIN) 800 MG tablet Take 1 tablet (800 mg total) by mouth every 8 (eight) hours as needed. 09/08/17   Evon Slack, PA-C  oxyCODONE 10 MG TABS Take 1 tablet (10 mg total) by mouth every 4 (four) hours as needed for moderate pain or severe pain. 01/31/16   Dorothea Ogle, MD  traMADol (ULTRAM) 50 MG tablet Take 1-2 tablets (50-100 mg total) by mouth every 6 (six) hours as needed. 09/08/17 09/08/18  Evon Slack, PA-C    Family History No family history on file.  Social History Social History   Tobacco Use  . Smoking status: Current Every Day Smoker    Packs/day: 0.25    Years: 8.00    Pack years: 2.00    Types: Cigarettes  . Smokeless  tobacco: Former Arts development officerUser  Substance Use Topics  . Alcohol use: Yes    Alcohol/week: 0.6 oz    Types: 1 Cans of beer per week  . Drug use: No     Allergies   Patient has no known allergies.   Review of Systems Review of Systems  Constitutional: Negative for fever.  HENT: Positive for dental problem. Negative for sore throat and trouble swallowing.   Eyes: Negative for photophobia, pain and visual disturbance.  Gastrointestinal: Negative for nausea and vomiting.  Skin: Negative for rash and wound.  Neurological: Negative for headaches.     Physical Exam Updated Vital Signs BP (!) 148/96 (BP Location: Left Arm)   Pulse 74   Temp 98.2 F (36.8 C) (Oral)   Resp 17   Ht  6\' 3"  (1.905 m)   Wt 77.1 kg (170 lb)   SpO2 99%   BMI 21.25 kg/m   Physical Exam  Constitutional: He is oriented to person, place, and time. He appears well-developed.  At times patient appears well with no discomfort at all but at other times he is holding his hand against his right lower jaw with and stating his pain is on the left.  At times when no one is in the room he is still and calm but as soon as provider enters he is writhing in pain.  HENT:  Head: Normocephalic and atraumatic.  Right Ear: External ear normal.  Left Ear: External ear normal.  Nose: Nose normal.  Mouth/Throat: Oropharynx is clear and moist. No oral lesions. No trismus in the jaw. Dental caries present. No dental abscesses, uvula swelling or lacerations. No oropharyngeal exudate, posterior oropharyngeal edema, posterior oropharyngeal erythema or tonsillar abscesses.    Eyes: Conjunctivae are normal.  Neck: Normal range of motion.  Cardiovascular: Normal rate.  Pulmonary/Chest: Effort normal. No respiratory distress. He has no wheezes.  Musculoskeletal: Normal range of motion.  Neurological: He is alert and oriented to person, place, and time.  Skin: Skin is warm. No rash noted.  Psychiatric: He has a normal mood and affect. His behavior is normal. Thought content normal.     ED Treatments / Results  Labs (all labs ordered are listed, but only abnormal results are displayed) Labs Reviewed - No data to display  EKG None  Radiology No results found.  Procedures .Nerve Block Date/Time: 09/08/2017 7:27 PM Performed by: Evon SlackGaines, Samiel Peel C, PA-C Authorized by: Evon SlackGaines, Laurice Kimmons C, PA-C   Consent:    Consent obtained:  Verbal   Consent given by:  Patient   Risks discussed:  Unsuccessful block, pain and swelling   Alternatives discussed:  No treatment Indications:    Indications:  Pain relief Location:    Nerve block body site: Mandible.   Laterality:  Left Procedure details (see MAR for exact  dosages):    Block needle gauge:  25 G   Anesthetic injected:  Bupivacaine 0.25% w/o epi and lidocaine 1% w/o epi   Steroid injected:  None   Additive injected:  None   Injection procedure:  Anatomic landmarks identified   Block paresthesia pain: Patient with moderate relief initially but then 5 minutes later his pain returned. Post-procedure details:    Outcome:  Pain unchanged   Patient tolerance of procedure:  Tolerated well, no immediate complications .Nerve Block Date/Time: 09/08/2017 7:28 PM Performed by: Evon SlackGaines, Evita Merida C, PA-C Authorized by: Evon SlackGaines, Rahn Lacuesta C, PA-C   Consent:    Consent obtained:  Verbal   Consent given by:  Patient  Risks discussed:  Unsuccessful block   Alternatives discussed:  No treatment Indications:    Indications:  Pain relief Location:    Laterality:  Left (Left lower molar) Procedure details (see MAR for exact dosages):    Block needle gauge:  30 G   Anesthetic injected:  Lidocaine 1% WITH epi   Steroid injected:  None   Additive injected:  None   Injection procedure:  Anatomic landmarks identified   Paresthesia:  Immediately resolved (Immediate resolved but then within a few minutes his pain returned.) Post-procedure details:    Dressing:  None   Outcome:  Pain unchanged   Patient tolerance of procedure:  Tolerated well, no immediate complications   (including critical care time)  Medications Ordered in ED Medications  lidocaine (PF) (XYLOCAINE) 1 % injection 5 mL ( Infiltration Canceled Entry 09/08/17 1855)  lidocaine-EPINEPHrine (XYLOCAINE W/EPI) 1 %-1:100000 (with pres) injection 1.3 mL (has no administration in time range)  lidocaine-EPINEPHrine (XYLOCAINE W/EPI) 2 %-1:100000 (with pres) injection (has no administration in time range)  ketorolac (TORADOL) 30 MG/ML injection 30 mg (has no administration in time range)  bupivacaine (MARCAINE) 0.25 % (with pres) injection 5 mL (5 mLs Infiltration Given by Other 09/08/17 1856)  amoxicillin  (AMOXIL) capsule 500 mg (500 mg Oral Given 09/08/17 1900)  traMADol (ULTRAM) tablet 50 mg (50 mg Oral Given 09/08/17 1900)     Initial Impression / Assessment and Plan / ED Course  I have reviewed the triage vital signs and the nursing notes.  Pertinent labs & imaging results that were available during my care of the patient were reviewed by me and considered in my medical decision making (see chart for details).     27 year old male with dental pain to the left lower wisdom tooth.  On exam multiple dental caries are noted but no swelling warmth redness, fluctuance.  No acute fracture.  Patient tolerating p.o. well.  His vital signs are stable, he is afebrile.  Pain was severe at times patient appears comfortable while other times he appeared writhing in pain.  His physical exam was unremarkable.  I offered mandibular nerve block which patient accepted and initially felt relief but then later his same pain returned.  Patient requested individual dental block to the left lower third molar which I agreed to and was performed.  Patient noted instant relief but then several minutes after his pain returned.  Patient's behavior during the history, physical exam and his response to the injections were somewhat concerning for seeking behavior.  I did try to alleviate patient's pain with tramadol, amoxicillin and Toradol injection.  Patient was given information on numerous dental clinics in [facility] as well as at [facility].  He will give these clinics to call.  He is educated on signs and symptoms to return for.  Final Clinical Impressions(s) / ED Diagnoses   Final diagnoses:  Pain due to dental caries  Pain, dental    ED Discharge Orders        Ordered    traMADol (ULTRAM) 50 MG tablet  Every 6 hours PRN     09/08/17 1912    amoxicillin (AMOXIL) 875 MG tablet  2 times daily     09/08/17 1912    ibuprofen (ADVIL,MOTRIN) 800 MG tablet  Every 8 hours PRN     09/08/17 1912       Ronnette Juniper 09/08/17 1932    Sharman Cheek, MD 09/09/17 [phone]

## 2017-09-08 NOTE — ED Notes (Signed)
See triage note  Presents with severe tooth pain  Pain is mainly to upper jaw    States he has had a   bad tooth for a long while    States pain became worse today

## 2017-12-11 ENCOUNTER — Emergency Department (HOSPITAL_COMMUNITY)
Admission: EM | Admit: 2017-12-11 | Discharge: 2017-12-11 | Disposition: A | Discharge status: Home / Self Care | Payer: Self-pay | Attending: Emergency Medicine | Admitting: Emergency Medicine

## 2017-12-11 ENCOUNTER — Emergency Department (HOSPITAL_COMMUNITY): Payer: Self-pay

## 2017-12-11 ENCOUNTER — Encounter (HOSPITAL_COMMUNITY): Payer: Self-pay | Admitting: Emergency Medicine

## 2017-12-11 DIAGNOSIS — L0201 Cutaneous abscess of face: Secondary | ICD-10-CM | POA: Insufficient documentation

## 2017-12-11 DIAGNOSIS — F17210 Nicotine dependence, cigarettes, uncomplicated: Secondary | ICD-10-CM | POA: Insufficient documentation

## 2017-12-11 DIAGNOSIS — K122 Cellulitis and abscess of mouth: Secondary | ICD-10-CM | POA: Insufficient documentation

## 2017-12-11 MED ORDER — IOHEXOL 300 MG/ML  SOLN
75.0000 mL | Freq: Once | INTRAMUSCULAR | Status: AC | PRN
  Administered 2017-12-11: 75 mL via INTRAVENOUS

## 2017-12-11 MED ORDER — LIDOCAINE-EPINEPHRINE 2 %-1:200000 IJ SOLN
20.0000 mL | Freq: Once | INTRAMUSCULAR | Status: AC
Start: 2017-12-11 — End: 2017-12-11
  Administered 2017-12-11: 20 mL
  Filled 2017-12-11: qty 20

## 2017-12-11 MED ORDER — CLINDAMYCIN HCL 150 MG PO CAPS: 450.0000 mg | ORAL_CAPSULE | Freq: Three times a day (TID) | ORAL | 0 refills | Status: AC

## 2017-12-11 MED ORDER — SODIUM CHLORIDE 0.9 % IJ SOLN
INTRAMUSCULAR | Status: AC
  Filled 2017-12-11: qty 50

## 2017-12-11 MED ORDER — OXYCODONE-ACETAMINOPHEN 5-325 MG PO TABS
1.0000 | ORAL_TABLET | Freq: Once | ORAL | Status: AC
  Administered 2017-12-11: 1 via ORAL
  Filled 2017-12-11: qty 1

## 2017-12-11 MED ORDER — ONDANSETRON 4 MG PO TBDP
4.0000 mg | ORAL_TABLET | Freq: Once | ORAL | Status: AC
  Administered 2017-12-11: 4 mg via ORAL
  Filled 2017-12-11: qty 1

## 2017-12-11 NOTE — Discharge Instructions (Addendum)
The CT scan today showed that you have cellulitis of your tooth. There was no tooth abscess, cyst or mass seen. There is also no infection of the deeper structures of your face which is a good thing.  I have prescribed you antibiotics (Clindamycin) for you to take that will treat your tooth infection as well as the abscess area on your face that I drained. Be sure to take the full seven (7) days worth of this antibiotic to ensure it is completely treated.  It is very important that you follow-up with a dentist for your tooth.  For pain, you can use Tylenol with Ibuprofen. Also applying warm compresses to your face may provide additional relief.  For the abscess, it will continue to drain some pus over the next 1-2 days. You can apply warm compresses to this area to help with it. You may wash with normal soap and water. Follow-up with a medical provider if you have one or more of the following symptoms after 2-3 days of starting the antibiotics: fever; increased redness, warmth or tenderness at the wound site; pain beyond where the initial wound was; unusual discharge.  Thank you for allowing [place] to take care of you today.

## 2017-12-11 NOTE — ED Provider Notes (Signed)
[facility]-EMERGENCY DEPT Provider Note  CSN: [id number] Arrival date & time: 12/11/17  1611    History   Chief Complaint Chief Complaint  Patient presents with  . Oral Swelling  . Generalized Body Aches  . Chills  . Dizziness    HPI Ryan Guerra is a 27 y.o. male with a medical history of GERD who presented to the ED for dental pain. Patient who presents with complaint of left lower dental pain. Onset of symptoms was gradual starting 1 month ago. Patient describes pain as aching and throbbing. Pain severity now is severe. The pain does not radiate. Patient endorses facial/jaw swelling which began this week. Denies fever, neck pain, decreased ROM, stridor, trismus or drooling. Endorses chills and  difficulty breathing. Pain is aggravated by use and palpation. Nothing tried for pain relief. Patient has sought treatment by another care provider for this problem. Care prior to arrival consisted of nothing. Patient currently not established with a dentist, but states he has an appointment next week.  Additional history obtained by medical chart. Patient seen at [facility] in 08/2017 for the same complaints.     Past Medical History:  Diagnosis Date  . Bipolar disorder (HCC)   . GERD (gastroesophageal reflux disease)    no current med.  Altamese Cabal nose 12/25/2015   clear drainage, per pt.  . 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 Sore throat 12/25/2015    Patient Active Problem List   Diagnosis Date Noted  . Sepsis (HCC) 01/26/2016  . Rash 01/26/2016  . Tobacco abuse 01/26/2016  . Fever   . Post op infection 01/17/2016  . 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 Tattoo   . Routine screening for STI (sexually transmitted infection)   . Post-operative infection 01/16/2016    Past Surgical History:  Procedure Laterality Date  . INCISION AND DRAINAGE Right 01/16/2016   shoulder  . IRRIGATION AND DEBRIDEMENT SHOULDER Right 01/16/2016   Procedure: IRRIGATION AND DEBRIDEMENT SHOULDER;  Surgeon: Jones Broom, MD;  Location: [facility] OR;  Service: Orthopedics;  Laterality: Right;  Irrigation and debridement right shoulder infection  . SHOULDER LATERJET Right 12/31/2015   Procedure: OPEN CORACOID TRANSFER;  Surgeon: Jones Broom, MD;  Location: [facility];  Service: Orthopedics;  Laterality: Right;  . 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 Authorizing Provider  ibuprofen (ADVIL,MOTRIN) 200 MG tablet Take 200 mg by mouth every 6 (six) hours as needed for moderate pain.   Yes [provider]  amoxicillin (AMOXIL) 875 MG tablet Take 1 tablet (875 mg total) by mouth 2 (two) times daily. X 10 days Patient not taking: Reported on 12/11/2017 09/08/17   Evon Slack, PA-C  clindamycin (CLEOCIN) 150 MG capsule Take 3 capsules (450 mg total) by mouth 3 (three) times daily for 7 days. 12/11/17 12/18/17  Mortis, Jerrel Ivory I, PA-C  ibuprofen (ADVIL,MOTRIN) 800 MG tablet Take 1 tablet (800 mg total) by mouth every 8 (eight) hours as needed. Patient not taking: Reported on 12/11/2017 09/08/17   Evon Slack, PA-C  oxyCODONE 10 MG TABS Take 1 tablet (10 mg total) by mouth every 4 (four) hours as needed for moderate pain or severe pain. Patient not taking: Reported on 12/11/2017 01/31/16   Dorothea Ogle, MD  traMADol Janean Sark) 50 MG tablet Take  1-2 tablets (50-100 mg total) by mouth every 6 (six) hours as needed. Patient not taking: Reported on 12/11/2017 09/08/17 09/08/18  Evon Slack, PA-C    Family History No family history on file.  Social History Social History   Tobacco Use  . Smoking status: Current Every Day Smoker    Packs/day: 0.25    Years: 8.00    Pack years: 2.00    Types: Cigarettes  . Smokeless tobacco: Former Engineer, water Use Topics  . Alcohol use: Yes    Alcohol/week: 1.0  standard drinks    Types: 1 Cans of beer per week  . Drug use: No   Allergies   Patient has no known allergies.   Review of Systems Review of Systems  Constitutional: Positive for chills. Negative for fever.  HENT: Positive for dental problem and facial swelling. Negative for trouble swallowing and voice change.   Respiratory: Negative for cough, choking, chest tightness, shortness of breath and stridor.   Cardiovascular: Negative for chest pain.  Musculoskeletal: Negative.   Skin: Positive for color change (Erythema to left jaw).  Neurological: Negative for dizziness, facial asymmetry, weakness, light-headedness and numbness.     Physical Exam Updated Vital Signs BP 120/83 (BP Location: Left Arm)   Pulse 67   Temp 97.9 F (36.6 C) (Oral)   Resp 18   SpO2 98%   Physical Exam  Constitutional: Vital signs are normal. He appears well-developed and well-nourished. He is cooperative.  HENT:  Head: Normocephalic and atraumatic.    Mouth/Throat: Oropharynx is clear and moist and mucous membranes are normal. No oral lesions. No trismus in the jaw. Abnormal dentition. Dental caries present. No dental abscesses.    Tooth #17 has signs of decay and is tender to palpation. No erythema, gingival swelling, drainage or areas of fluctuance or induration. Mild erythema seen to the left mandibular area which is tender to palpation. Patient's TMJ has full ROM. No trismus.  Neck: Trachea normal, normal range of motion, full passive range of motion without pain and phonation normal. Neck supple. No spinous process tenderness and no muscular tenderness present. No neck rigidity. Normal range of motion present.  Cardiovascular: Normal rate, regular rhythm and normal heart sounds.  Pulmonary/Chest: Effort normal and breath sounds normal. No respiratory distress.  Able to speak in complete sentences. Normal work of breathing.  Lymphadenopathy:    He has no cervical adenopathy.  Neurological: He  is alert.  Skin: Skin is warm and intact. Capillary refill takes less than 2 seconds. No rash noted. There is erythema.  Nursing note and vitals reviewed.  ED Treatments / Results  Labs (all labs ordered are listed, but only abnormal results are displayed) Labs Reviewed - No data to display  EKG None  Radiology Ct Maxillofacial W Contrast  Result Date: 12/11/2017 CLINICAL DATA:  Facial swelling EXAM: CT MAXILLOFACIAL WITH CONTRAST TECHNIQUE: Multidetector CT imaging of the maxillofacial structures was performed with intravenous contrast. Multiplanar CT image reconstructions were also generated. CONTRAST:  75mL OMNIPAQUE IOHEXOL 300 MG/ML  SOLN COMPARISON:  None. FINDINGS: Osseous: No facial fracture. Dental: There is a large carie at the anterior aspect of tooth 17, where there is also a large periapical lucency. There is dehiscence of the underlying mandibular cortex medially. There is inflammation within the left retromolar trigone extending to the lateral left face. There is a heterogeneous collection developing in the left submental space measuring 2.4 x 1.6 cm. No subperiosteal abscess. Orbits: The globes are intact. Normal  appearance of the intra- and extraconal fat. Symmetric extraocular muscles. Sinuses: Mild maxillary sinus mucosal thickening. Soft tissues: Extensive left facial soft tissue swelling skin thickening. Limited intracranial: Normal. IMPRESSION: 1. Odontogenic cellulitis of the lower left face, arising from periapical lucency at the root of tooth 17, where there is dehiscence of the medial aspect of the mandibular cortex and extension of inflammation to the left retromolar trigone. 2. Heterogeneous area in the left submental space likely indicates early stages of abscess development. Electronically Signed   By: Deatra Robinson M.D.   On: 12/11/2017 19:05    Procedures .Marland KitchenIncision and Drainage Date/Time: 12/11/2017 7:35 PM Performed by: Windy Carina, PA-C Authorized by:  Windy Carina, PA-C   Consent:    Consent obtained:  Verbal   Consent given by:  Patient   Risks discussed:  Bleeding, incomplete drainage, infection and pain   Alternatives discussed:  No treatment Location:    Type:  Abscess   Size:  2cm   Location:  Head   Head/neck location: Left chin. Pre-procedure details:    Skin preparation:  Betadine Anesthesia (see MAR for exact dosages):    Anesthesia method:  Local infiltration   Local anesthetic:  Lidocaine 2% WITH epi Procedure type:    Complexity:  Simple Procedure details:    Incision types:  Stab incision   Incision depth:  Subcutaneous   Scalpel blade:  11   Wound management:  Probed and deloculated   Drainage:  Purulent   Drainage amount:  Moderate   Wound treatment:  Wound left open   Packing materials:  None Post-procedure details:    Patient tolerance of procedure:  Tolerated well, no immediate complications   (including critical care time)  Medications Ordered in ED Medications  sodium chloride 0.9 % injection (has no administration in time range)  oxyCODONE-acetaminophen (PERCOCET/ROXICET) 5-325 MG per tablet 1 tablet (1 tablet Oral Given 12/11/17 1821)  ondansetron (ZOFRAN-ODT) disintegrating tablet 4 mg (4 mg Oral Given 12/11/17 1821)  iohexol (OMNIPAQUE) 300 MG/ML solution 75 mL (75 mLs Intravenous Contrast Given 12/11/17 1839)  lidocaine-EPINEPHrine (XYLOCAINE W/EPI) 2 %-1:200000 (PF) injection 20 mL (20 mLs Infiltration Given 12/11/17 1923)   Initial Impression / Assessment and Plan / ED Course  Triage vital signs and the nursing notes have been reviewed.  Pertinent labs & imaging results that were available during care of the patient were reviewed and considered in medical decision making (see chart for details).  Patient presents to the ED for left lower dental pain that has been present for 1 month on and off. He is afebrile and his vital signs were normal. Patient last sought treatment for this in  08/2017, but states he never followed up with a dentist or oral surgeon. He reports an acute worsening of pain over the last week with associated facial swelling. While patient complains of difficulty breathing, he is able to speak in complete sentences, has normal work of breath, no trismus, stridor or decreased neck ROM to suggest that deeper neck structures are involved. However, there is visible swelling to the left mandible with some erythema. Will order CT maxillofacial to evaluate if there is a deep tissue infection from patient's tooth.  Additionally, a small 1.5-2cm abscess is seen on the patient's chin. Has some surrounding erythema suggestive of cellulitis. No current drainage will perform I&D pending CT max which will also help visualize this area as well.  Clinical Course as of Dec 12 1934  Fri Dec 11, 2017  1908 CT max shows odontogenic cellulitis. No dental abscess seen.There is no infiltration of deeper of jaw or neck. Imaging also shows abscess on chin that is visualized on physical exam.   [GM]    Clinical Course User Index [GM] Mortis, Sharyon Medicus, PA-C   Final Clinical Impressions(s) / ED Diagnoses  1. Dental Cellulitis. Clindamycin 450mg  TID x7 days prescribed. Advised to follow-up with dentist for scheduled appointment. 2. Chin Abscess. I&D performed in the ED without complication. Clindamycin as prescribed above. Education provided on wound care, follow-up and s/s of infection.  Dispo: Home. After thorough clinical evaluation, this patient is determined to be medically stable and can be safely discharged with the previously mentioned treatment and/or outpatient follow-up/referral(s). At this time, there are no other apparent medical conditions that require further screening, evaluation or treatment.   Final diagnoses:  Dentoalveolar cellulitis  Abscess of chin    ED Discharge Orders         Ordered    clindamycin (CLEOCIN) 150 MG capsule  3 times daily     12/11/17  1911            Reva Bores 12/11/17 1936    Tegeler, Canary Brim, MD 12/12/17 0004

## 2017-12-11 NOTE — ED Triage Notes (Signed)
Pt reports that he had broke, dead tooth and had a cyst. Reports that cyst has gotten larger in size. Today when woke up has redness and increased in swelling. Today also having body aches and chills. Reports that having dizziness for month.

## 2018-01-29 IMAGING — MR MR SHOULDER*R* WO/W CM
7 of 14 series · 18 of 40 positions shown · IV contrast (Yes   MULTIHANCE)
Comparison: Preoperative CT 09/20/2015. Intraoperative radiographs
12/31/2015.

CLINICAL DATA: 24-year-old status post open coracoid transfer
surgery for dislocation 12/31/2015 complicated by postoperative
infection and septic arthritis requiring irrigation and drainage
01/16/2016. Subsequent antibiotics. Persistent pain, swelling and
fever.

EXAM:
MRI OF THE RIGHT SHOULDER WITHOUT AND WITH CONTRAST
TECHNIQUE: Multiplanar, multisequence MR imaging of the right shoulder shoulder
was performed before and after the administration of intravenous
contrast.
CONTRAST:  15mL MULTIHANCE GADOBENATE DIMEGLUMINE 529 MG/ML IV SOLN

[Series 2: T2 fat-sat · axial · 4.0mm · 0.23mm/px · z∈[-56,+46]mm · 2 of 22 slices shown (1 of 3)]
[im 1/22]
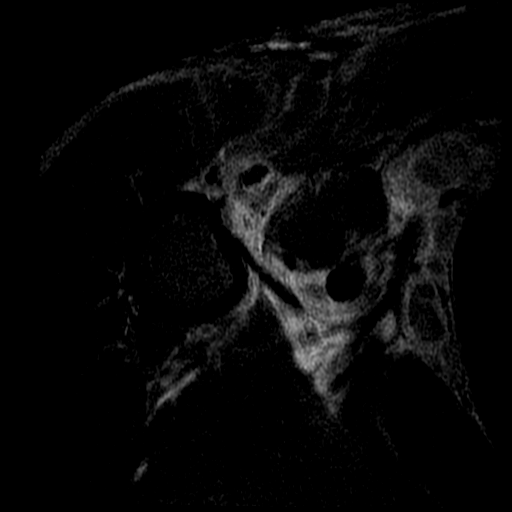
[im 22/22]
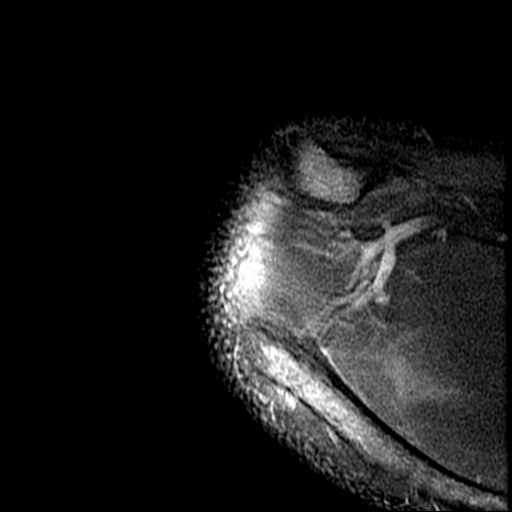

[Series 3: T1 fat-sat · axial · 4.0mm · 0.23mm/px · z∈[-56,+46]mm · 2 of 22 slices shown]
[im 1/22]
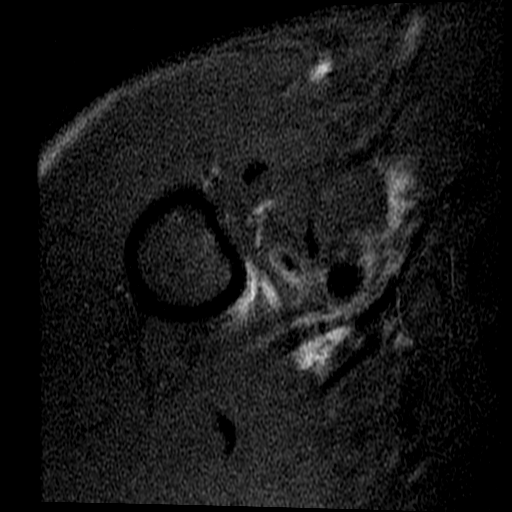
[im 22/22]
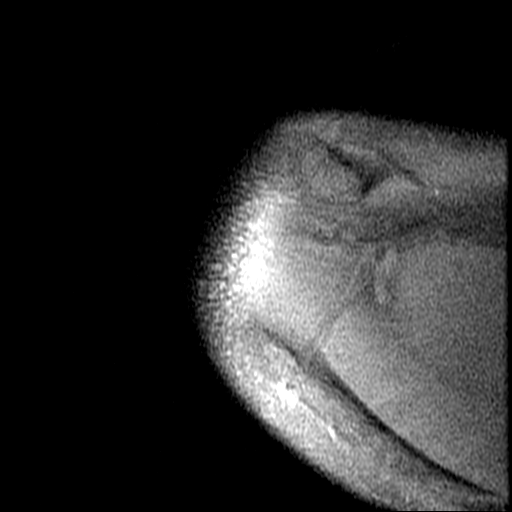

[Series 4: T1 · coronal · 4.0mm · 0.29mm/px · 3 of 18 slices shown]
[im 1/18]
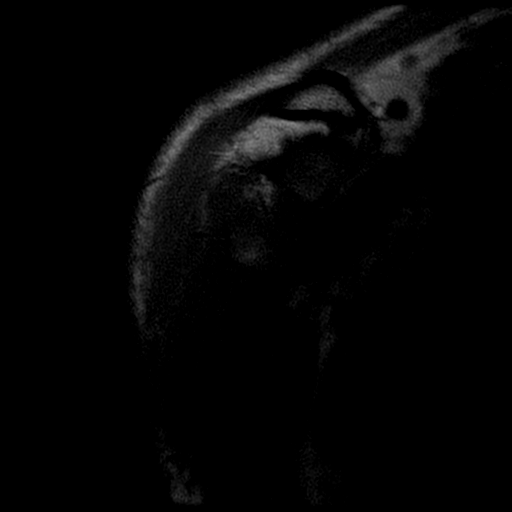
[im 9/18]
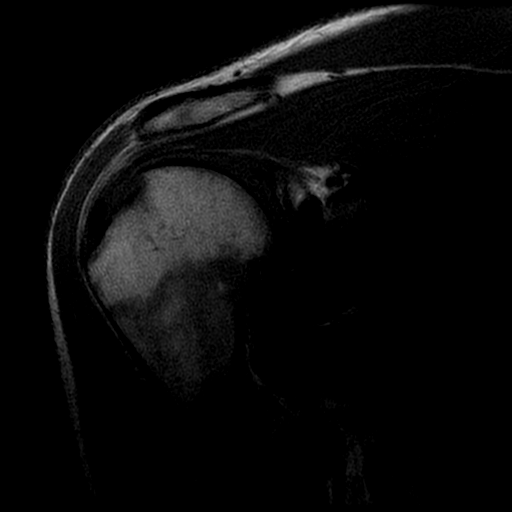
[im 18/18]
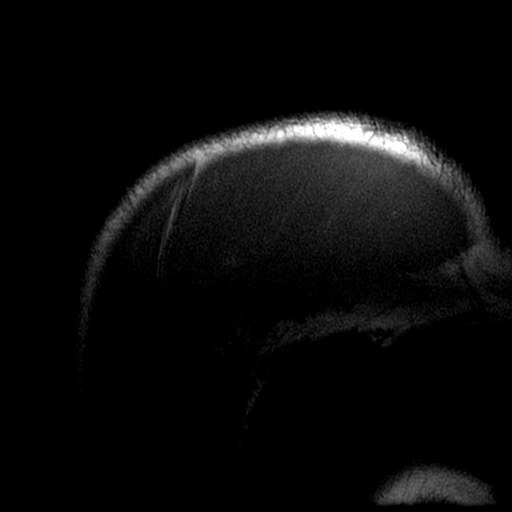

[Series 5: T2 fat-sat · coronal · 4.0mm · 0.29mm/px · 3 of 18 slices shown (2 of 3)]
[im 1/18]
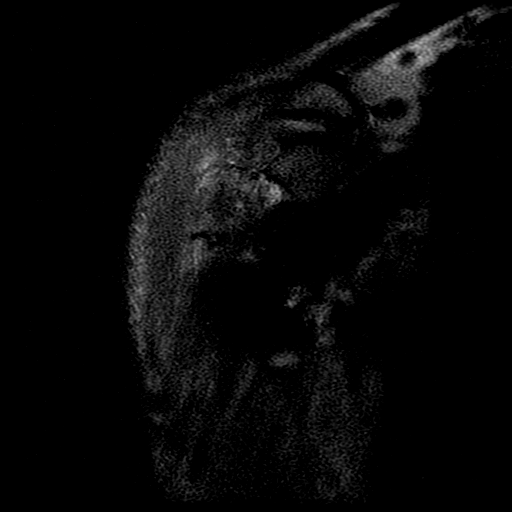
[im 9/18]
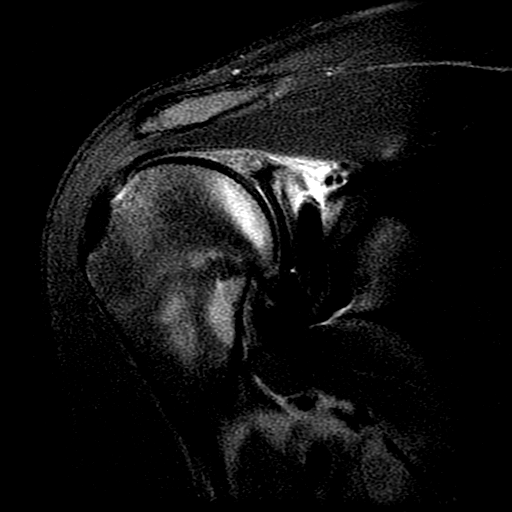
[im 18/18]
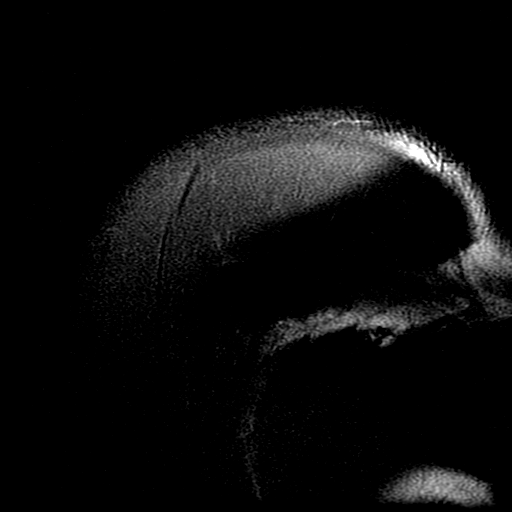

[Series 6: PD fat-sat · coronal · 4.0mm · 0.29mm/px · 3 of 18 slices shown]
[im 1/18]
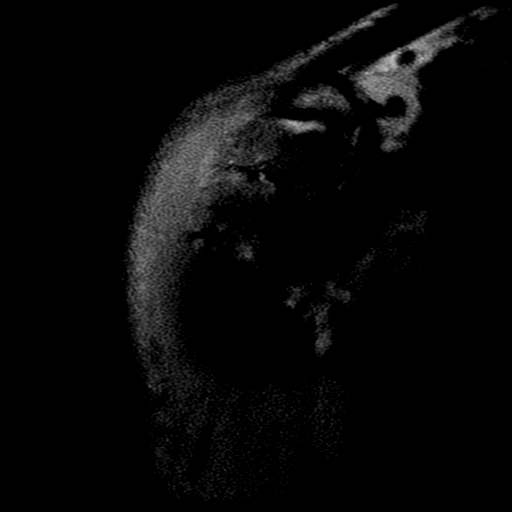
[im 9/18]
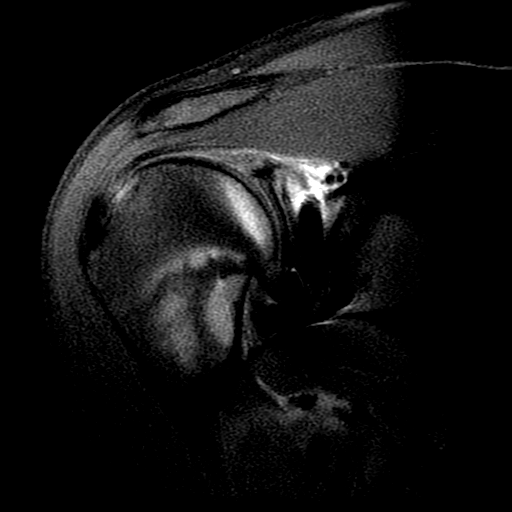
[im 18/18]
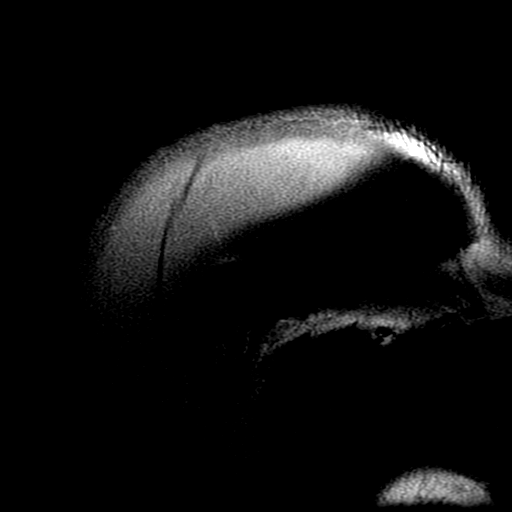

[Series 7: T2 fat-sat · sagittal · 4.0mm · 0.29mm/px · 3 of 21 slices shown (3 of 3)]
[im 1/21]
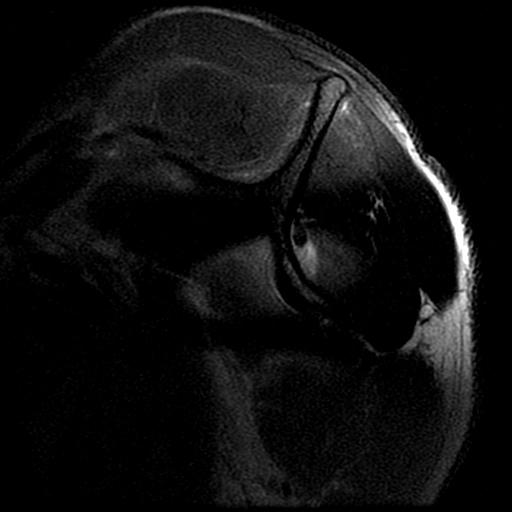
[im 11/21]
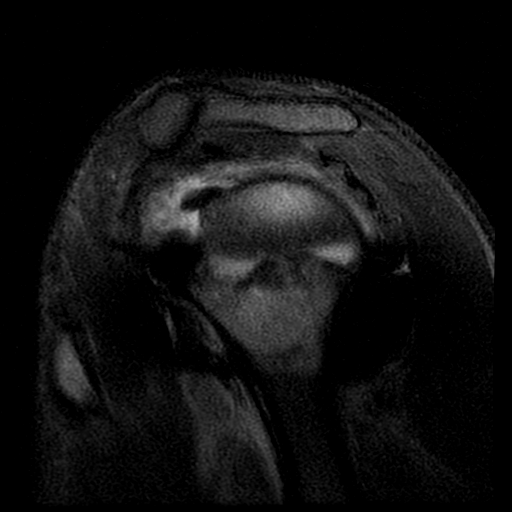
[im 21/21]
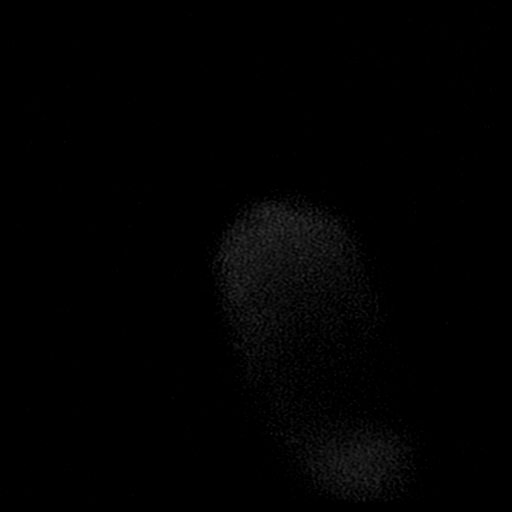

[Series 13: T1 fat-sat post-contrast · coronal · 4.0mm · 0.29mm/px · 2 of 18 slices shown]
[im 1/18]
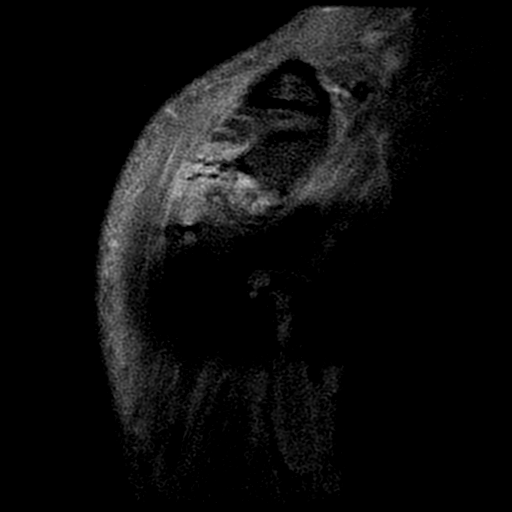
[im 9/18]
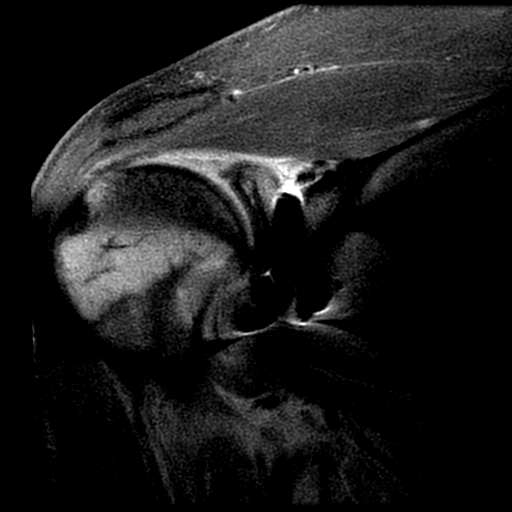

[18 of 40 positions shown; findings below may reference images not displayed]

FINDINGS: There is moderate susceptibility artifact related to the 2 anchor
screws posteriorly in the glenoid. Protocol was adjusted to minimize
this artifact.

Rotator cuff:  Intact.

Muscles: Presumed postsurgical changes anteriorly within the deltoid
and subscapularis muscles with heterogeneous T2 signal, small fluid
collections and diffuse enhancement. The additional components of
the rotator cuff appear normal.

Biceps long head:  Intact and normally positioned.

Acromioclavicular Joint: Normal. Type 1 acromion. No significant
subacromial bursal fluid.

Glenohumeral Joint: Marked synovial thickening with diffuse
enhancement following contrast. There is relatively little joint
fluid on the post-contrast images, mostly in the axillary recess
(image 17 of series 15). Synovial enhancement extends into the
bicipital groove.

Labrum: Partly obscured by artifact posteriorly and inferiorly. No
evidence labral tear.

Bones: Postsurgical changes related to open coracoid transfer and
inferior glenoid reconstruction. Marrow assessment limited by
artifact. There is no worrisome marrow signal or enhancement. There
is no cortical destruction. Chronic Hill-Sachs deformity noted.

Other: There is a track of peripherally enhancing fluid extending
anteriorly into the deltoid muscle, measuring up to 4.0 x 1.1 cm on
image 17 of series 15.
IMPRESSION: 1. Severe synovitis with marked synovial thickening and fairly
homogeneous enhancement following contrast. There is minimal joint
fluid.
2. Small amount of fluid extending anteriorly into the deltoid
muscle, presumed postsurgical. No other periarticular fluid
collections. Potential anterior myositis.
3. Postsurgical changes from inferior glenoid reconstruction. No
evidence of osteomyelitis.

## 2018-09-02 ENCOUNTER — Telehealth: Payer: Self-pay | Admitting: *Deleted

## 2018-09-02 DIAGNOSIS — Z20822 Contact with and (suspected) exposure to covid-19: Secondary | ICD-10-CM

## 2018-09-02 NOTE — Telephone Encounter (Signed)
Ryan Guerra - [facility] Calling to request COVID testing on family of 5- parent have symptoms/recent travel to [place]  Exposure for children 

## 2018-09-03 ENCOUNTER — Other Ambulatory Visit: Payer: Self-pay

## 2018-09-03 DIAGNOSIS — Z20822 Contact with and (suspected) exposure to covid-19: Secondary | ICD-10-CM

## 2018-09-08 LAB — NOVEL CORONAVIRUS, NAA: SARS-CoV-2, NAA: NOT DETECTED

## 2019-08-08 ENCOUNTER — Ambulatory Visit: Payer: Self-pay | Attending: Internal Medicine

## 2021-01-23 ENCOUNTER — Encounter (HOSPITAL_BASED_OUTPATIENT_CLINIC_OR_DEPARTMENT_OTHER): Payer: Self-pay | Admitting: Emergency Medicine

## 2021-01-23 ENCOUNTER — Emergency Department (HOSPITAL_BASED_OUTPATIENT_CLINIC_OR_DEPARTMENT_OTHER)
Admission: EM | Admit: 2021-01-23 | Discharge: 2021-01-23 | Disposition: A | Discharge status: Home / Self Care | Payer: Managed Care, Other (non HMO) | Attending: Emergency Medicine | Admitting: Emergency Medicine

## 2021-01-23 ENCOUNTER — Other Ambulatory Visit: Payer: Self-pay

## 2021-01-23 ENCOUNTER — Emergency Department (HOSPITAL_BASED_OUTPATIENT_CLINIC_OR_DEPARTMENT_OTHER): Payer: Managed Care, Other (non HMO)

## 2021-01-23 DIAGNOSIS — F17210 Nicotine dependence, cigarettes, uncomplicated: Secondary | ICD-10-CM | POA: Insufficient documentation

## 2021-01-23 DIAGNOSIS — Z20822 Contact with and (suspected) exposure to covid-19: Secondary | ICD-10-CM | POA: Diagnosis not present

## 2021-01-23 DIAGNOSIS — R000 Tachycardia, unspecified: Secondary | ICD-10-CM | POA: Diagnosis not present

## 2021-01-23 DIAGNOSIS — R079 Chest pain, unspecified: Secondary | ICD-10-CM | POA: Insufficient documentation

## 2021-01-23 DIAGNOSIS — M791 Myalgia, unspecified site: Secondary | ICD-10-CM | POA: Diagnosis present

## 2021-01-23 DIAGNOSIS — B349 Viral infection, unspecified: Secondary | ICD-10-CM | POA: Insufficient documentation

## 2021-01-23 DIAGNOSIS — R252 Cramp and spasm: Secondary | ICD-10-CM

## 2021-01-23 LAB — CBC WITH DIFFERENTIAL/PLATELET
Abs Immature Granulocytes: 0.04 10*3/uL (ref 0.00–0.07)
Basophils Absolute: 0 10*3/uL (ref 0.0–0.1)
Basophils Relative: 0 %
Eosinophils Absolute: 0 10*3/uL (ref 0.0–0.5)
Eosinophils Relative: 0 %
HCT: 45.5 % (ref 39.0–52.0)
Hemoglobin: 16 g/dL (ref 13.0–17.0)
Immature Granulocytes: 0 %
Lymphocytes Relative: 9 %
Lymphs Abs: 0.8 10*3/uL (ref 0.7–4.0)
MCH: 30.1 pg (ref 26.0–34.0)
MCHC: 35.2 g/dL (ref 30.0–36.0)
MCV: 85.5 fL (ref 80.0–100.0)
Monocytes Absolute: 0.7 10*3/uL (ref 0.1–1.0)
Monocytes Relative: 8 %
Neutro Abs: 7.6 10*3/uL (ref 1.7–7.7)
Neutrophils Relative %: 83 %
Platelets: 178 10*3/uL (ref 150–400)
RBC: 5.32 MIL/uL (ref 4.22–5.81)
RDW: 11.9 % (ref 11.5–15.5)
WBC: 9.3 10*3/uL (ref 4.0–10.5)
nRBC: 0 % (ref 0.0–0.2)

## 2021-01-23 LAB — RESP PANEL BY RT-PCR (FLU A&B, COVID) ARPGX2
Influenza A by PCR: NEGATIVE
Influenza B by PCR: NEGATIVE
SARS Coronavirus 2 by RT PCR: NEGATIVE

## 2021-01-23 LAB — URINALYSIS, ROUTINE W REFLEX MICROSCOPIC
Bilirubin Urine: NEGATIVE
Glucose, UA: NEGATIVE mg/dL
Hgb urine dipstick: NEGATIVE
Ketones, ur: NEGATIVE mg/dL
Leukocytes,Ua: NEGATIVE
Nitrite: NEGATIVE
Protein, ur: NEGATIVE mg/dL
Specific Gravity, Urine: 1.019 (ref 1.005–1.030)
pH: 7.5 (ref 5.0–8.0)

## 2021-01-23 LAB — TROPONIN I (HIGH SENSITIVITY)
Troponin I (High Sensitivity): 3 ng/L (ref <18)
Troponin I (High Sensitivity): 3 ng/L (ref <18)

## 2021-01-23 LAB — LIPASE, BLOOD: Lipase: 44 U/L (ref 11–51)

## 2021-01-23 LAB — COMPREHENSIVE METABOLIC PANEL
ALT: 18 U/L (ref 0–44)
AST: 17 U/L (ref 15–41)
Albumin: 4.7 g/dL (ref 3.5–5.0)
Alkaline Phosphatase: 67 U/L (ref 38–126)
Anion gap: 12 (ref 5–15)
BUN: 16 mg/dL (ref 6–20)
CO2: 27 mmol/L (ref 22–32)
Calcium: 10 mg/dL (ref 8.9–10.3)
Chloride: 98 mmol/L (ref 98–111)
Creatinine, Ser: 1.03 mg/dL (ref 0.61–1.24)
GFR, Estimated: >60 mL/min (ref >60)
Glucose, Bld: 104 mg/dL — ABNORMAL HIGH (ref 70–99)
Potassium: 3.7 mmol/L (ref 3.5–5.1)
Sodium: 137 mmol/L (ref 135–145)
Total Bilirubin: 0.8 mg/dL (ref 0.3–1.2)
Total Protein: 7.3 g/dL (ref 6.5–8.1)

## 2021-01-23 LAB — RAPID URINE DRUG SCREEN, HOSP PERFORMED
Amphetamines: NOT DETECTED
Barbiturates: NOT DETECTED
Benzodiazepines: NOT DETECTED
Cocaine: NOT DETECTED
Opiates: NOT DETECTED
Tetrahydrocannabinol: POSITIVE — AB

## 2021-01-23 LAB — CK: Total CK: 56 U/L (ref 49–397)

## 2021-01-23 LAB — D-DIMER, QUANTITATIVE: D-Dimer, Quant: 0.31 ug/mL-FEU (ref 0.00–0.50)

## 2021-01-23 MED ORDER — SODIUM CHLORIDE 0.9 % IV BOLUS
1000.0000 mL | Freq: Once | INTRAVENOUS | Status: AC
  Administered 2021-01-23: 1000 mL via INTRAVENOUS

## 2021-01-23 MED ORDER — DIAZEPAM 5 MG PO TABS: 5.0000 mg | ORAL_TABLET | Freq: Two times a day (BID) | ORAL | 0 refills | Status: AC | PRN

## 2021-01-23 MED ORDER — ACETAMINOPHEN 325 MG PO TABS
650.0000 mg | ORAL_TABLET | Freq: Once | ORAL | Status: AC
  Administered 2021-01-23: 650 mg via ORAL
  Filled 2021-01-23: qty 2

## 2021-01-23 MED ORDER — KETOROLAC TROMETHAMINE 15 MG/ML IJ SOLN
15.0000 mg | Freq: Once | INTRAMUSCULAR | Status: AC
  Administered 2021-01-23: 15 mg via INTRAVENOUS
  Filled 2021-01-23: qty 1

## 2021-01-23 MED ORDER — DIAZEPAM 5 MG/ML IJ SOLN: 5.0000 mg | Freq: Once | INTRAMUSCULAR | Status: DC

## 2021-01-23 MED ORDER — IBUPROFEN 800 MG PO TABS: 800.0000 mg | ORAL_TABLET | Freq: Three times a day (TID) | ORAL | 0 refills | Status: AC | PRN

## 2021-01-23 MED ORDER — DIAZEPAM 5 MG PO TABS
5.0000 mg | ORAL_TABLET | Freq: Once | ORAL | Status: AC
  Administered 2021-01-23: 5 mg via ORAL
  Filled 2021-01-23: qty 1

## 2021-01-23 NOTE — ED Provider Notes (Signed)
Ryan Guerra   CSN: [id number] Arrival date & time: 01/23/21  1650     History Chief Complaint  Patient presents with   Generalized Body Aches    Ryan Guerra is a 30 y.o. male.  This is a 30 y.o. male with significant medical history as below, including bipolar disorder who presents to the ED with complaint of diffuse arthralgias, myalgias.  Patient reports approximate 4 days of diffuse muscle spasms, body aches.  Feels as though his urine is mildly darker than normal.  Transient episode of chest pain earlier in the day approximately 6 hours ago.  Reduced oral intake the past few days, generalized malaise, subjective fevers, chills.  He feels as though his "arms are locking up."  Not associate with trauma, no recent medication changes or dietary changes.  No IV drug use.  No excessive stimulant use.  Patient does endorse vaping.  Otherwise no other tobacco use.  He has not experienced similar symptoms in the past.  Patient feels as though his "anxiety is through the roof" and that he is having a "level 10 muscle spasm."  He feels as though his "feet are vibrating."    Patient  reports he takes no medications and has not been taking medications for his symptoms over the past few days.  The history is provided by the patient. No language interpreter was used.      Past Medical History:  Diagnosis Date   Bipolar disorder ([place])    GERD (gastroesophageal reflux disease)    no current med.   Runny nose 12/25/2015   clear drainage, per pt.   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   Sore throat 12/25/2015    Patient Active Problem List   Diagnosis Date Noted   Sepsis ([place]) 01/26/2016   Rash 01/26/2016   Tobacco abuse 01/26/2016   Fever    Post op infection 01/17/2016   Pyogenic arthritis of right shoulder region [facility])    Tattoo    Routine screening for STI (sexually transmitted infection)    Post-operative infection 01/16/2016     Past Surgical History:  Procedure Laterality Date   INCISION AND DRAINAGE Right 01/16/2016   shoulder   IRRIGATION AND DEBRIDEMENT SHOULDER Right 01/16/2016   Procedure: IRRIGATION AND DEBRIDEMENT SHOULDER;  Surgeon: Tania Ade, MD;  Location: [place];  Service: Orthopedics;  Laterality: Right;  Irrigation and debridement right shoulder infection   SHOULDER LATERJET Right 12/31/2015   Procedure: OPEN CORACOID TRANSFER;  Surgeon: Tania Ade, MD;  Location: [place];  Service: Orthopedics;  Laterality: Right;   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  Social History   Tobacco Use   Smoking status: Every Day    Packs/day: 0.25    Years: 8.00    Pack years: 2.00    Types: Cigarettes   Smokeless tobacco: Former  Scientific laboratory technician Use: Every day  Substance Use Topics   Alcohol use: Yes    Alcohol/week: 1.0 standard drink    Types: 1 Cans of beer per week   Drug use: No    Home Medications Prior to Admission medications   Medication Sig Start Date End Date Taking? Authorizing Provider  diazepam (VALIUM) 5 MG tablet Take 1 tablet (5 mg total) by mouth every 12 (twelve) hours as needed for anxiety or muscle spasms. 01/23/21  Yes Wynona Dove A, DO  ibuprofen (  ADVIL) 800 MG tablet Take 1 tablet (800 mg total) by mouth every 8 (eight) hours as needed for mild pain or moderate pain. 01/23/21  Yes Tanda Rockers A, DO  amoxicillin (AMOXIL) 875 MG tablet Take 1 tablet (875 mg total) by mouth 2 (two) times daily. X 10 days Patient not taking: Reported on 12/11/2017 09/08/17   Evon Slack, PA-C  oxyCODONE 10 MG TABS Take 1 tablet (10 mg total) by mouth every 4 (four) hours as needed for moderate pain or severe pain. Patient not taking: Reported on 12/11/2017 01/31/16   Dorothea Ogle, MD    Allergies    Patient has no known allergies.  Review of Systems   Review of Systems  Constitutional:  Negative for chills and  fever.  HENT:  Negative for facial swelling and trouble swallowing.   Eyes:  Negative for photophobia and visual disturbance.  Respiratory:  Negative for cough and shortness of breath.   Cardiovascular:  Negative for chest pain and palpitations.  Gastrointestinal:  Negative for abdominal pain, nausea and vomiting.  Endocrine: Negative for polydipsia and polyuria.  Genitourinary:  Negative for difficulty urinating and hematuria.  Musculoskeletal:  Positive for arthralgias and neck pain. Negative for gait problem and joint swelling.  Skin:  Negative for pallor and rash.  Neurological:  Negative for syncope and headaches.  Psychiatric/Behavioral:  Negative for agitation and confusion. The patient is nervous/anxious and is hyperactive.    Physical Exam Updated Vital Signs BP 111/65   Pulse 75   Temp 98.6 F (37 C)   Resp 20   Ht 6\' 3"  (1.905 m)   Wt 77.1 kg   SpO2 97%   BMI 21.25 kg/m   Physical Exam Vitals and nursing Guerra reviewed.  Constitutional:      General: He is not in acute distress.    Appearance: He is well-developed.  HENT:     Head: Normocephalic and atraumatic.     Right Ear: External ear normal.     Left Ear: External ear normal.     Mouth/Throat:     Mouth: Mucous membranes are moist.  Eyes:     General: No scleral icterus. Cardiovascular:     Rate and Rhythm: Regular rhythm. Tachycardia present.     Pulses: Normal pulses.          Radial pulses are 2+ on the right side and 2+ on the left side.       Dorsalis pedis pulses are 2+ on the right side and 2+ on the left side.     Heart sounds: Normal heart sounds.  Pulmonary:     Effort: Pulmonary effort is normal. No respiratory distress.     Breath sounds: Normal breath sounds.  Abdominal:     General: Abdomen is flat.     Palpations: Abdomen is soft.     Tenderness: There is no abdominal tenderness.  Musculoskeletal:        General: Normal range of motion.     Cervical back: Normal range of motion.      Right lower leg: No edema.     Left lower leg: No edema.     Comments: Extremities warm, well perfused, compartments are soft.  Skin:    General: Skin is warm and dry.     Capillary Refill: Capillary refill takes less than 2 seconds.  Neurological:     Mental Status: He is alert and oriented to person, place, and time.  Psychiatric:  Mood and Affect: Mood is anxious. Affect is labile.        Behavior: Behavior is hyperactive.    ED Results / Procedures / Treatments   Labs (all labs ordered are listed, but only abnormal results are displayed) Labs Reviewed  COMPREHENSIVE METABOLIC PANEL - Abnormal; Notable for the following components:      Result Value   Glucose, Bld 104 (*)    All other components within normal limits  RAPID URINE DRUG SCREEN, HOSP PERFORMED - Abnormal; Notable for the following components:   Tetrahydrocannabinol POSITIVE (*)    All other components within normal limits  RESP PANEL BY RT-PCR (FLU A&B, COVID) ARPGX2  CBC WITH DIFFERENTIAL/PLATELET  LIPASE, BLOOD  D-DIMER, QUANTITATIVE  CK  URINALYSIS, ROUTINE W REFLEX MICROSCOPIC  TROPONIN I (HIGH SENSITIVITY)  TROPONIN I (HIGH SENSITIVITY)    EKG EKG Interpretation  Date/Time:  Wednesday January 23 2021 18:55:00 EST Ventricular Rate:  90 PR Interval:  100 QRS Duration: 102 QT Interval:  331 QTC Calculation: 405 R Axis:   -38 Text Interpretation: Sinus rhythm Short PR interval Left axis deviation RSR' in V1 or V2, probably normal variant Similar to prior tracing 9 /16 Confirmed by Tanda RockersGray, Melanie Pellot ([id number]) on 01/23/2021 8:50:35 PM  Radiology DG Chest Portable 1 View  Result Date: 01/23/2021 CLINICAL DATA:  Chest pain EXAM: PORTABLE CHEST 1 VIEW COMPARISON:  01/25/2016 FINDINGS: The heart size and mediastinal contours are within normal limits. Both lungs are clear. The visualized skeletal structures are unremarkable. IMPRESSION: No active disease. Electronically Signed   By: Jasmine PangKim  Fujinaga M.D.   On:  01/23/2021 19:44    Procedures Procedures   Medications Ordered in ED Medications  sodium chloride 0.9 % bolus 1,000 mL (0 mLs Intravenous Stopped 01/23/21 2002)  ketorolac (TORADOL) 15 MG/ML injection 15 mg (15 mg Intravenous Given 01/23/21 1959)  diazepam (VALIUM) tablet 5 mg (5 mg Oral Given 01/23/21 1959)  acetaminophen (TYLENOL) tablet 650 mg (650 mg Oral Given 01/23/21 2248)    ED Course  I have reviewed the triage vital signs and the nursing notes.  Pertinent labs & imaging results that were available during my care of the patient were reviewed by me and considered in my medical decision making (see chart for details).  Clinical Course as of 01/23/21 2354  Wed Jan 23, 2021  2049 Counseled patient for approximately 4 minutes regarding smoking cessation. Discussed risks of smoking and how they applied and affected their visit here today. Patient not ready to quit at this time, however will follow up with their primary doctor when they are.   CPT code: [id number]: intermediate counseling for smoking cessation    [SG]    Clinical Course User Index [SG] Sloan LeiterGray, Jeneva Schweizer A, DO   MDM Rules/Calculators/A&P                           CC: Body aches, anxiety, chest pain, whole body pain  This patient complains of above; this involves an extensive number of treatment options and is a complaint that carries with it a high risk of complications and morbidity. Vital signs were reviewed. Serious etiologies considered.  Record review:   Previous records obtained and reviewed    Work up as above, notable for:  Labs & imaging results that were available during my care of the patient were reviewed by me and considered in my medical decision making.   I ordered imaging studies which included chest x-ray  and I independently visualized and interpreted imaging which showed no acute process  Patient with tachycardia, transient chest pain, low risk Wells score.  D-dimer is not elevated.  PE is  unlikely.  Management: IV fluids, Toradol, Valium  Reassessment:  Patient does report that he is feeling better.  He is ambulatory without difficulty, he is tolerating oral intake without difficulty.  Labs reviewed and are stable.  Troponin negative x2, EKG with evidence acute ischemia, chest x-ray negative.  ACS is  unlikely.  Low risk Wells score, D-dimer is negative.  PE is unlikely.  Etiology of symptoms is unclear at this time however favor likely viral infection versus psychosomatic.  Patient given short course of Valium for home.  Discussed use of over-the-counter anti-inflammatory medications for his muscle aches.  The patient improved significantly and was discharged in stable condition. Detailed discussions were had with the patient regarding current findings, and need for close f/u with PCP or on call doctor. The patient has been instructed to return immediately if the symptoms worsen in any way for re-evaluation. Patient verbalized understanding and is in agreement with current care plan. All questions answered prior to discharge.           This chart was dictated using voice recognition software.  Despite best efforts to proofread,  errors can occur which can change the documentation meaning.  Final Clinical Impression(s) / ED Diagnoses Final diagnoses:  Viral syndrome  Muscle cramps    Rx / DC Orders ED Discharge Orders          Ordered    diazepam (VALIUM) 5 MG tablet  Every 12 hours PRN        01/23/21 2216    ibuprofen (ADVIL) 800 MG tablet  Every 8 hours PRN        01/23/21 2216             Jeanell Sparrow, DO 01/23/21 2354

## 2021-01-23 NOTE — ED Triage Notes (Signed)
Reports body aches, fever, sore throat.  Reports he passed out from the pain.

## 2021-01-23 NOTE — ED Notes (Signed)
Pt complained of Pain to his left upper quadrant of a 11 out of a 10 down both legs. Pt was tachycardic and tachypneic; hooked to 12 lead EKG shot, MD notified.
